# Patient Record
Sex: Male | Born: 1935 | Race: White | Hispanic: No | Smoking: Never smoker
Health system: Southern US, Community
[De-identification: ages and names within clinical notes are randomized; demographics above are authoritative.]

## PROBLEM LIST (undated history)

## (undated) DIAGNOSIS — I4891 Unspecified atrial fibrillation: Secondary | ICD-10-CM

## (undated) DIAGNOSIS — I509 Heart failure, unspecified: Secondary | ICD-10-CM

## (undated) DIAGNOSIS — F0390 Unspecified dementia without behavioral disturbance: Secondary | ICD-10-CM

## (undated) DIAGNOSIS — I10 Essential (primary) hypertension: Secondary | ICD-10-CM

## (undated) DIAGNOSIS — M1990 Unspecified osteoarthritis, unspecified site: Secondary | ICD-10-CM

## (undated) DIAGNOSIS — I351 Nonrheumatic aortic (valve) insufficiency: Secondary | ICD-10-CM

## (undated) DIAGNOSIS — IMO0002 Reserved for concepts with insufficient information to code with codable children: Secondary | ICD-10-CM

## (undated) DIAGNOSIS — I340 Nonrheumatic mitral (valve) insufficiency: Secondary | ICD-10-CM

## (undated) HISTORY — PX: BACK SURGERY: SHX140

## (undated) HISTORY — PX: APPENDECTOMY: SHX54

---

## 2010-06-09 ENCOUNTER — Encounter: Admission: RE | Admit: 2010-06-09 | Discharge: 2010-06-09 | Payer: Self-pay | Admitting: Internal Medicine

## 2010-06-23 ENCOUNTER — Ambulatory Visit (HOSPITAL_COMMUNITY): Admission: RE | Admit: 2010-06-23 | Discharge: 2010-06-23 | Payer: Self-pay | Admitting: Gastroenterology

## 2010-09-05 ENCOUNTER — Ambulatory Visit
Admission: RE | Admit: 2010-09-05 | Discharge: 2010-09-05 | Payer: Self-pay | Source: Home / Self Care | Attending: Internal Medicine | Admitting: Internal Medicine

## 2010-09-05 ENCOUNTER — Encounter (INDEPENDENT_AMBULATORY_CARE_PROVIDER_SITE_OTHER): Payer: Self-pay | Admitting: Internal Medicine

## 2010-12-21 ENCOUNTER — Emergency Department (HOSPITAL_COMMUNITY): Payer: Medicare Other

## 2010-12-21 ENCOUNTER — Encounter (HOSPITAL_COMMUNITY): Payer: Self-pay | Admitting: Radiology

## 2010-12-21 ENCOUNTER — Emergency Department (HOSPITAL_COMMUNITY)
Admission: EM | Admit: 2010-12-21 | Discharge: 2010-12-21 | Disposition: A | Discharge status: Home / Self Care | Payer: Medicare Other | Attending: Emergency Medicine | Admitting: Emergency Medicine

## 2010-12-21 DIAGNOSIS — G319 Degenerative disease of nervous system, unspecified: Secondary | ICD-10-CM | POA: Insufficient documentation

## 2010-12-21 DIAGNOSIS — I10 Essential (primary) hypertension: Secondary | ICD-10-CM | POA: Insufficient documentation

## 2010-12-21 DIAGNOSIS — R51 Headache: Secondary | ICD-10-CM | POA: Insufficient documentation

## 2010-12-21 HISTORY — DX: Essential (primary) hypertension: I10

## 2010-12-21 LAB — POCT I-STAT, CHEM 8
BUN: 20 mg/dL (ref 6–23)
Chloride: 107 mEq/L (ref 96–112)
HCT: 45 % (ref 39.0–52.0)
Potassium: 4 mEq/L (ref 3.5–5.1)
Sodium: 138 mEq/L (ref 135–145)

## 2010-12-21 LAB — DIFFERENTIAL
Lymphs Abs: 1.6 10*3/uL (ref 0.7–4.0)
Monocytes Relative: 11 % (ref 3–12)
Neutro Abs: 3.4 10*3/uL (ref 1.7–7.7)
Neutrophils Relative %: 56 % (ref 43–77)

## 2010-12-21 LAB — SEDIMENTATION RATE: Sed Rate: 5 mm/hr (ref 0–16)

## 2010-12-21 LAB — CBC
Hemoglobin: 15.2 g/dL (ref 13.0–17.0)
MCH: 34.3 pg — ABNORMAL HIGH (ref 26.0–34.0)
MCV: 98.9 fL (ref 78.0–100.0)
RBC: 4.43 MIL/uL (ref 4.22–5.81)
WBC: 6 10*3/uL (ref 4.0–10.5)

## 2010-12-21 LAB — PROTIME-INR: Prothrombin Time: 14.4 seconds (ref 11.6–15.2)

## 2011-02-07 ENCOUNTER — Other Ambulatory Visit: Payer: Self-pay | Admitting: Internal Medicine

## 2011-02-15 ENCOUNTER — Other Ambulatory Visit: Payer: Medicare Other

## 2011-03-01 ENCOUNTER — Ambulatory Visit
Admission: RE | Admit: 2011-03-01 | Discharge: 2011-03-01 | Disposition: A | Discharge status: Home / Self Care | Payer: Medicare Other | Source: Ambulatory Visit | Attending: Internal Medicine | Admitting: Internal Medicine

## 2011-12-29 ENCOUNTER — Other Ambulatory Visit (HOSPITAL_COMMUNITY): Payer: Self-pay | Admitting: Internal Medicine

## 2012-01-03 ENCOUNTER — Ambulatory Visit (HOSPITAL_COMMUNITY)
Admission: RE | Admit: 2012-01-03 | Discharge: 2012-01-03 | Disposition: A | Discharge status: Home / Self Care | Payer: Medicare Other | Source: Ambulatory Visit | Attending: Internal Medicine | Admitting: Internal Medicine

## 2012-01-03 DIAGNOSIS — R1310 Dysphagia, unspecified: Secondary | ICD-10-CM | POA: Insufficient documentation

## 2012-01-03 DIAGNOSIS — R1313 Dysphagia, pharyngeal phase: Secondary | ICD-10-CM | POA: Insufficient documentation

## 2012-01-03 NOTE — Procedures (Signed)
Objective Swallowing Evaluation: Modified Barium Swallowing Study  Patient Details  Name: Sean Carlson MRN: [id number] Date of Birth: 28-Jul-1926  Today's Date: 01/03/2012 Time:  -     Past Medical History:  Past Medical History  Diagnosis Date  . Hypertension    Past Surgical History: No past surgical history on file. HPI:  78 yr old seen at [facility] for MBS accompanied by his son.  Pt. denies difficulty, however, son reports pt. had esophageal dilitation approximately one year ago at which time he was experiencing freqeunt expectoration of saliva that appeared to improve somewhat after procedure.  Son denies pt. coughing related to food/liquid.  Pt. with several episodes of slurred speech with CVA ruled out but possible TIA's per son.  No history of pneumonia.  Pt. lives in independent section at [facility] with frequent checks from family.     Recommendation/Prognosis  Clinical Impression Dysphagia Diagnosis: Mild pharyngeal phase dysphagia Clinical impression: Pt. exhibited min-mild pharyngeal dysphagia marked by reduced tongue base retraction and mildly decreased pharyngeal dysphagia resulting in min-mild vallecular and pyriform sinus residue.  Pt. with adequate sensation to produce reflexive second swalllow that cleared residue.  One episode of laryngeal penetration out of multiple trials with thin that was flash only (spontaneously exited laryngeal vestibule during the swallow).  Esophagus was scanned which did not reveal overt deficits, although the MBS only diagnoses the upper esophageal sphincter. Recommend pt. continue with a regular texture diet and thin liquids with small cup sips, eat slower and perform a second swallow if needed.  Swallow Evaluation Recommendations Diet Recommendations: Regular;Thin liquid Liquid Administration via: Cup Medication Administration: Whole meds with liquid Supervision: Patient able to self feed Compensations: Slow rate;Small sips/bites;Multiple  dry swallows after each bite/sip Postural Changes and/or Swallow Maneuvers: Seated upright 90 degrees Follow up Recommendations: None   Individuals Consulted Consulted and Agree with Results and Recommendations: Patient;Family member/caregiver  SLP Assessment/Plan Dysphagia Diagnosis: Mild pharyngeal phase dysphagia Clinical impression: Pt. exhibited min-mild pharyngeal dysphagia marked by reduced tongue base retraction and mildly decreased pharyngeal dysphagia resulting in min-mild vallecular and pyriform sinus residue.  Pt. with adequate sensation to produce reflexive second swalllow that cleared residue.  One episode of laryngeal penetration out of multiple trials with thin that was flash only (spontaneously exited laryngeal vestibule during the swallow).  Esophagus was scanned which did not reveal overt deficits, although the MBS only diagnoses the upper esophageal sphincter. Recommend pt. continue with a regular texture diet and thin liquids with small cup sips, eat slower and perform a second swallow if needed.   General:  HPI: 78 yr old seen at [facility] for MBS accompanied by his son.  Pt. denies difficulty, however, son reports pt. had esophageal dilitation approximately one year ago at which time he was experiencing freqeunt expectoration of saliva that appeared to improve somewhat after procedure.  Son denies pt. coughing related to food/liquid.  Pt. with several episodes of slurred speech with CVA ruled out but possible TIA's per son.  No history of pneumonia.  Pt. lives in independent section at [facility] with frequent checks from family. Type of Study: Modified Barium Swallowing Study Diet Prior to this Study: Regular;Thin liquids Respiratory Status: Room air Behavior/Cognition: Alert;Cooperative;Pleasant mood (mild confusion) Oral Cavity - Dentition: Adequate natural dentition Oral Motor / Sensory Function: Impaired motor (lingual deviation to right, possible slight left labial  droo) Oral impairment: Left labial Vision: Functional for self-feeding Patient Positioning: Upright in chair Baseline Vocal Quality: Wet (intermittently wet prior to  po's) Volitional Cough: Strong Volitional Swallow:  (NT) Anatomy: Within functional limits Pharyngeal Secretions: Not observed secondary MBS  Oral Phase Oral Preparation/Oral Phase Oral Phase: WFL Pharyngeal Phase  Pharyngeal Phase Pharyngeal Phase: Impaired Pharyngeal - Thin Pharyngeal - Thin Teaspoon: Reduced tongue base retraction;Reduced laryngeal elevation;Pharyngeal residue - valleculae;Pharyngeal residue - pyriform (min-mild) Pharyngeal - Thin Cup: Reduced laryngeal elevation;Reduced tongue base retraction;Pharyngeal residue - valleculae;Pharyngeal residue - pyriform;Penetration/Aspiration during swallow (min-mild) Penetration/Aspiration details (thin cup): Material enters airway, remains ABOVE vocal cords then ejected out (flash x 1) Pharyngeal - Solids Pharyngeal - Puree: Within functional limits Pharyngeal - Regular: Within functional limits Cervical Esophageal Phase  Cervical Esophageal Phase Cervical Esophageal Phase: [facility]   Darrow Bussing.Ed [employer] [phone]  01/03/2012

## 2013-10-28 ENCOUNTER — Other Ambulatory Visit: Payer: Self-pay | Admitting: Internal Medicine

## 2013-10-28 ENCOUNTER — Ambulatory Visit
Admission: RE | Admit: 2013-10-28 | Discharge: 2013-10-28 | Disposition: A | Discharge status: Home / Self Care | Payer: Medicare Other | Source: Ambulatory Visit | Attending: Internal Medicine | Admitting: Internal Medicine

## 2013-10-28 DIAGNOSIS — R609 Edema, unspecified: Secondary | ICD-10-CM

## 2014-01-10 ENCOUNTER — Emergency Department (HOSPITAL_COMMUNITY)
Admission: EM | Admit: 2014-01-10 | Discharge: 2014-01-10 | Disposition: A | Discharge status: Home / Self Care | Payer: Medicare Other | Attending: Emergency Medicine | Admitting: Emergency Medicine

## 2014-01-10 ENCOUNTER — Encounter (HOSPITAL_COMMUNITY): Payer: Self-pay | Admitting: Emergency Medicine

## 2014-01-10 ENCOUNTER — Emergency Department (HOSPITAL_COMMUNITY): Payer: Medicare Other

## 2014-01-10 DIAGNOSIS — R55 Syncope and collapse: Secondary | ICD-10-CM

## 2014-01-10 DIAGNOSIS — F0390 Unspecified dementia without behavioral disturbance: Secondary | ICD-10-CM | POA: Insufficient documentation

## 2014-01-10 DIAGNOSIS — E872 Acidosis, unspecified: Secondary | ICD-10-CM

## 2014-01-10 DIAGNOSIS — R61 Generalized hyperhidrosis: Secondary | ICD-10-CM | POA: Insufficient documentation

## 2014-01-10 DIAGNOSIS — I10 Essential (primary) hypertension: Secondary | ICD-10-CM | POA: Insufficient documentation

## 2014-01-10 DIAGNOSIS — R197 Diarrhea, unspecified: Secondary | ICD-10-CM | POA: Insufficient documentation

## 2014-01-10 DIAGNOSIS — R42 Dizziness and giddiness: Secondary | ICD-10-CM | POA: Insufficient documentation

## 2014-01-10 DIAGNOSIS — Z8739 Personal history of other diseases of the musculoskeletal system and connective tissue: Secondary | ICD-10-CM | POA: Insufficient documentation

## 2014-01-10 HISTORY — DX: Unspecified dementia, unspecified severity, without behavioral disturbance, psychotic disturbance, mood disturbance, and anxiety: F03.90

## 2014-01-10 HISTORY — DX: Unspecified osteoarthritis, unspecified site: M19.90

## 2014-01-10 LAB — URINE MICROSCOPIC-ADD ON

## 2014-01-10 LAB — URINALYSIS, ROUTINE W REFLEX MICROSCOPIC
Bilirubin Urine: NEGATIVE
Glucose, UA: NEGATIVE mg/dL
Ketones, ur: NEGATIVE mg/dL
LEUKOCYTES UA: NEGATIVE
NITRITE: NEGATIVE
PH: 5.5 (ref 5.0–8.0)
Protein, ur: NEGATIVE mg/dL
SPECIFIC GRAVITY, URINE: 1.026 (ref 1.005–1.030)
Urobilinogen, UA: 1 mg/dL (ref 0.0–1.0)

## 2014-01-10 LAB — COMPREHENSIVE METABOLIC PANEL
ALBUMIN: 3.4 g/dL — AB (ref 3.5–5.2)
ALT: 37 U/L (ref 0–53)
AST: 40 U/L — ABNORMAL HIGH (ref 0–37)
Alkaline Phosphatase: 85 U/L (ref 39–117)
BUN: 31 mg/dL — AB (ref 6–23)
CO2: 20 mEq/L (ref 19–32)
CREATININE: 1.16 mg/dL (ref 0.50–1.35)
Calcium: 9.1 mg/dL (ref 8.4–10.5)
Chloride: 105 mEq/L (ref 96–112)
GFR calc Af Amer: 64 mL/min — ABNORMAL LOW (ref >90)
GFR calc non Af Amer: 55 mL/min — ABNORMAL LOW (ref >90)
Glucose, Bld: 175 mg/dL — ABNORMAL HIGH (ref 70–99)
Potassium: 4.4 mEq/L (ref 3.7–5.3)
Sodium: 140 mEq/L (ref 137–147)
TOTAL PROTEIN: 6.2 g/dL (ref 6.0–8.3)
Total Bilirubin: 0.9 mg/dL (ref 0.3–1.2)

## 2014-01-10 LAB — I-STAT CG4 LACTIC ACID, ED
LACTIC ACID, VENOUS: 1.06 mmol/L (ref 0.5–2.2)
Lactic Acid, Venous: 2.41 mmol/L — ABNORMAL HIGH (ref 0.5–2.2)

## 2014-01-10 LAB — CBC WITH DIFFERENTIAL/PLATELET
BASOS PCT: 0 % (ref 0–1)
Basophils Absolute: 0 10*3/uL (ref 0.0–0.1)
EOS ABS: 0.3 10*3/uL (ref 0.0–0.7)
Eosinophils Relative: 5 % (ref 0–5)
HEMATOCRIT: 47.8 % (ref 39.0–52.0)
HEMOGLOBIN: 16.9 g/dL (ref 13.0–17.0)
Lymphocytes Relative: 33 % (ref 12–46)
Lymphs Abs: 2.3 10*3/uL (ref 0.7–4.0)
MCH: 35.4 pg — AB (ref 26.0–34.0)
MCHC: 35.4 g/dL (ref 30.0–36.0)
MCV: 100.2 fL — ABNORMAL HIGH (ref 78.0–100.0)
MONO ABS: 0.6 10*3/uL (ref 0.1–1.0)
MONOS PCT: 8 % (ref 3–12)
NEUTROS PCT: 54 % (ref 43–77)
Neutro Abs: 3.9 10*3/uL (ref 1.7–7.7)
Platelets: 151 10*3/uL (ref 150–400)
RBC: 4.77 MIL/uL (ref 4.22–5.81)
RDW: 12.1 % (ref 11.5–15.5)
WBC: 7.1 10*3/uL (ref 4.0–10.5)

## 2014-01-10 LAB — CBG MONITORING, ED: Glucose-Capillary: 184 mg/dL — ABNORMAL HIGH (ref 70–99)

## 2014-01-10 LAB — TROPONIN I

## 2014-01-10 MED ORDER — SODIUM CHLORIDE 0.9 % IV SOLN
INTRAVENOUS | Status: DC
  Administered 2014-01-10: 14:00:00 via INTRAVENOUS

## 2014-01-10 MED ORDER — SODIUM CHLORIDE 0.9 % IV BOLUS (SEPSIS)
1000.0000 mL | Freq: Once | INTRAVENOUS | Status: AC
  Administered 2014-01-10: 1000 mL via INTRAVENOUS

## 2014-01-10 MED ORDER — LOPERAMIDE HCL 2 MG PO CAPS: 2.0000 mg | ORAL_CAPSULE | Freq: Four times a day (QID) | ORAL | Status: DC | PRN

## 2014-01-10 NOTE — ED Notes (Signed)
NOTIFIED DR. LOCKWOOD OF PATIENTS LAB RESULTS OF CG4+ LACTIC ACID ,16:24 PM ,01/10/2014.

## 2014-01-10 NOTE — ED Notes (Signed)
Patient returned from X-ray 

## 2014-01-10 NOTE — ED Provider Notes (Signed)
CSN: [id number]     Arrival date & time 01/10/14  1059 History   First MD Initiated Contact with Patient 01/10/14 1106     Chief Complaint  Patient presents with  . Near Syncope      HPI  Patient presents after an episode of near syncope. Patient is a nursing home resident. Patient has dementia, level V caveat. Per report the patient was defecating, became diaphoretic, lightheaded, but did not fall, did not lose consciousness. Patient denies pain either during the event, or currently. He does describe generalized weakness, but no focal weakness. Per EMS the patient was pale on their arrival, improved in route.   Past Medical History  Diagnosis Date  . Hypertension   . Dementia   . Osteoarthritis    Past Surgical History  Procedure Laterality Date  . Appendectomy     History reviewed. No pertinent family history. History  Substance Use Topics  . Smoking status: Never Smoker   . Smokeless tobacco: Not on file  . Alcohol Use: Yes     Comment: occasionally    Review of Systems  Constitutional:       Per HPI, otherwise negative  HENT:       Per HPI, otherwise negative  Respiratory:       Per HPI, otherwise negative  Cardiovascular:       Per HPI, otherwise negative  Gastrointestinal: Positive for diarrhea. Negative for vomiting and abdominal pain.  Endocrine:       Negative aside from HPI  Genitourinary:       Neg aside from HPI   Musculoskeletal:       Per HPI, otherwise negative  Skin: Positive for pallor.  Neurological: Positive for light-headedness. Negative for syncope.      Allergies  Review of patient's allergies indicates no known allergies.  Home Medications   Prior to Admission medications   Not on File   BP 146/64  Pulse 65  Temp(Src) 98.3 F (36.8 C) (Oral)  Resp 20  Ht 5\' 10"  (1.778 m)  Wt 148 lb (67.132 kg)  BMI 21.24 kg/m2  SpO2 100% Physical Exam  Nursing note and vitals reviewed. Constitutional: He is oriented to person,  place, and time. He appears well-developed. No distress.  HENT:  Head: Normocephalic and atraumatic.  Eyes: Conjunctivae and EOM are normal.  Cardiovascular: Normal rate and regular rhythm.   Pulmonary/Chest: Effort normal. No stridor. No respiratory distress.  Abdominal: He exhibits no distension. There is no tenderness. There is no rebound and no guarding.  Musculoskeletal: He exhibits no edema.  Neurological: He is alert and oriented to person, place, and time.  Skin: Skin is warm and dry.  Psychiatric: He has a normal mood and affect.    ED Course  Procedures (including critical care time) Labs Review Labs Reviewed  CBC WITH DIFFERENTIAL - Abnormal; Notable for the following:    MCV 100.2 (*)    MCH 35.4 (*)    All other components within normal limits  I-STAT CG4 LACTIC ACID, ED - Abnormal; Notable for the following:    Lactic Acid, Venous 2.41 (*)    All other components within normal limits  CBG MONITORING, ED - Abnormal; Notable for the following:    Glucose-Capillary 184 (*)    All other components within normal limits  COMPREHENSIVE METABOLIC PANEL  TROPONIN I  URINALYSIS, ROUTINE W REFLEX MICROSCOPIC  POCT CBG (FASTING - GLUCOSE)-MANUAL ENTRY    Imaging Review Dg Chest 2 View  01/10/2014  CLINICAL DATA:  Syncope  EXAM: CHEST  2 VIEW  COMPARISON:  DG CHEST 2V dated 08/30/2011  FINDINGS: Heart size upper normal to mildly enlarged. Vascular pattern normal. Lungs clear. No effusions.  IMPRESSION: No active cardiopulmonary disease.   Electronically Signed   By: Esperanza Heiraymond  Rubner M.D.   On: 01/10/2014 11:59     EKG Interpretation   Date/Time:  Saturday January 10 2014 11:24:58 EDT Ventricular Rate:  65 PR Interval:  214 QRS Duration: 90 QT Interval:  490 QTC Calculation: 510 R Axis:   -12 Text Interpretation:  Sinus rhythm Borderline prolonged PR interval  Anteroseptal infarct, old Nonspecific T abnormalities, lateral leads  Prolonged QT interval Sinus rhythm T  wave inversion , new Abnormal ekg  Confirmed by Gerhard MunchLOCKWOOD, Marisol Giambra  MD ([id number]) on 01/10/2014 12:15:20 PM       1:31 PM Patient comfortable VS stable UA pending  3:45 PM Patient HD stable, no complaints. Repeat lactic pending.  4:41 PM VS unremarkable Repeat lactic acid 1  Patient d/c w son  MDM   Patient presents after an episode of near syncope, while defecating. Here the patient has no evidence of dysrhythmia, no decompensation. Patient's initial labs are notable for mild elevation in lactic acid, though this improved substantially with fluid resuscitation here.  The patient is afebrile, in no distress, with a soft, non-peritoneal abdomen. Within this improvement, no decompensation, and with stable vital signs throughout, the patient was discharged in stable condition with his son back to a monitored facility to follow up with his primary care physician.    Gerhard Munchobert Bohdan Macho, MD 01/10/14 [phone]

## 2014-01-10 NOTE — Discharge Instructions (Signed)
As discussed, it is important that you follow up as soon as possible with your physician for continued management of your condition. ° °If you develop any new, or concerning changes in your condition, please return to the emergency department immediately. ° °

## 2014-01-10 NOTE — ED Notes (Signed)
From [facility] assisted living; during a bowel movement he became diaphoretic and felt like he was going to pass out, but no LOC. EMS reports he was pale on arrival, VSS. He arrives with loose stools on body; he reports loose stool this morning x1. Denies pain. States feels a little short of breath.

## 2014-01-10 NOTE — ED Notes (Signed)
NOTIFIED DR. LOCKWOOD IN PERSON FOR PATIENTS LAB RESULTS OF CG4+ LACTIC ACID ,@12 ;;;;;;;;;;;;00 PM ,01/10/2014.

## 2014-01-10 NOTE — ED Notes (Signed)
Patient transported to X-ray 

## 2014-01-13 ENCOUNTER — Other Ambulatory Visit: Payer: Self-pay | Admitting: Internal Medicine

## 2014-01-13 ENCOUNTER — Ambulatory Visit
Admission: RE | Admit: 2014-01-13 | Discharge: 2014-01-13 | Disposition: A | Discharge status: Home / Self Care | Payer: Medicare Other | Source: Ambulatory Visit | Attending: Internal Medicine | Admitting: Internal Medicine

## 2014-01-13 DIAGNOSIS — R600 Localized edema: Secondary | ICD-10-CM

## 2014-03-19 ENCOUNTER — Encounter (HOSPITAL_COMMUNITY): Payer: Self-pay | Admitting: Emergency Medicine

## 2014-03-19 ENCOUNTER — Inpatient Hospital Stay (HOSPITAL_COMMUNITY)
Admission: EM | Admit: 2014-03-19 | Discharge: 2014-03-24 | DRG: 292 | Disposition: A | Discharge status: Skilled Nursing Facility | Payer: Medicare Other | Attending: Internal Medicine | Admitting: Internal Medicine

## 2014-03-19 ENCOUNTER — Emergency Department (HOSPITAL_COMMUNITY): Payer: Medicare Other

## 2014-03-19 DIAGNOSIS — J9801 Acute bronchospasm: Secondary | ICD-10-CM | POA: Diagnosis present

## 2014-03-19 DIAGNOSIS — I5041 Acute combined systolic (congestive) and diastolic (congestive) heart failure: Secondary | ICD-10-CM

## 2014-03-19 DIAGNOSIS — R918 Other nonspecific abnormal finding of lung field: Secondary | ICD-10-CM | POA: Diagnosis present

## 2014-03-19 DIAGNOSIS — I5023 Acute on chronic systolic (congestive) heart failure: Principal | ICD-10-CM | POA: Diagnosis present

## 2014-03-19 DIAGNOSIS — I10 Essential (primary) hypertension: Secondary | ICD-10-CM

## 2014-03-19 DIAGNOSIS — Z9089 Acquired absence of other organs: Secondary | ICD-10-CM

## 2014-03-19 DIAGNOSIS — Z79899 Other long term (current) drug therapy: Secondary | ICD-10-CM

## 2014-03-19 DIAGNOSIS — R5381 Other malaise: Secondary | ICD-10-CM | POA: Diagnosis present

## 2014-03-19 DIAGNOSIS — I4892 Unspecified atrial flutter: Secondary | ICD-10-CM | POA: Diagnosis present

## 2014-03-19 DIAGNOSIS — I498 Other specified cardiac arrhythmias: Secondary | ICD-10-CM | POA: Diagnosis present

## 2014-03-19 DIAGNOSIS — J9819 Other pulmonary collapse: Secondary | ICD-10-CM | POA: Diagnosis present

## 2014-03-19 DIAGNOSIS — I509 Heart failure, unspecified: Secondary | ICD-10-CM | POA: Diagnosis present

## 2014-03-19 DIAGNOSIS — I4510 Unspecified right bundle-branch block: Secondary | ICD-10-CM | POA: Diagnosis present

## 2014-03-19 DIAGNOSIS — M1990 Unspecified osteoarthritis, unspecified site: Secondary | ICD-10-CM | POA: Diagnosis present

## 2014-03-19 DIAGNOSIS — F0391 Unspecified dementia with behavioral disturbance: Secondary | ICD-10-CM

## 2014-03-19 DIAGNOSIS — R7989 Other specified abnormal findings of blood chemistry: Secondary | ICD-10-CM | POA: Diagnosis present

## 2014-03-19 DIAGNOSIS — Z8249 Family history of ischemic heart disease and other diseases of the circulatory system: Secondary | ICD-10-CM

## 2014-03-19 DIAGNOSIS — F0390 Unspecified dementia without behavioral disturbance: Secondary | ICD-10-CM

## 2014-03-19 DIAGNOSIS — K219 Gastro-esophageal reflux disease without esophagitis: Secondary | ICD-10-CM | POA: Diagnosis present

## 2014-03-19 DIAGNOSIS — Z66 Do not resuscitate: Secondary | ICD-10-CM | POA: Diagnosis present

## 2014-03-19 LAB — CBC WITH DIFFERENTIAL/PLATELET
Basophils Absolute: 0 10*3/uL (ref 0.0–0.1)
Basophils Relative: 0 % (ref 0–1)
Eosinophils Absolute: 0.4 10*3/uL (ref 0.0–0.7)
Eosinophils Relative: 6 % — ABNORMAL HIGH (ref 0–5)
HCT: 45.6 % (ref 39.0–52.0)
Hemoglobin: 15.3 g/dL (ref 13.0–17.0)
Lymphocytes Relative: 30 % (ref 12–46)
Lymphs Abs: 2.3 10*3/uL (ref 0.7–4.0)
MCH: 35 pg — ABNORMAL HIGH (ref 26.0–34.0)
MCHC: 33.6 g/dL (ref 30.0–36.0)
MCV: 104.3 fL — ABNORMAL HIGH (ref 78.0–100.0)
Monocytes Absolute: 0.8 10*3/uL (ref 0.1–1.0)
Monocytes Relative: 10 % (ref 3–12)
Neutro Abs: 4.1 10*3/uL (ref 1.7–7.7)
Neutrophils Relative %: 54 % (ref 43–77)
Platelets: 149 10*3/uL — ABNORMAL LOW (ref 150–400)
RBC: 4.37 MIL/uL (ref 4.22–5.81)
RDW: 13.7 % (ref 11.5–15.5)
WBC: 7.7 10*3/uL (ref 4.0–10.5)

## 2014-03-19 LAB — BASIC METABOLIC PANEL
Anion gap: 13 (ref 5–15)
BUN: 35 mg/dL — ABNORMAL HIGH (ref 6–23)
CO2: 24 mEq/L (ref 19–32)
Calcium: 8.7 mg/dL (ref 8.4–10.5)
Chloride: 103 mEq/L (ref 96–112)
Creatinine, Ser: 0.91 mg/dL (ref 0.50–1.35)
GFR calc Af Amer: 86 mL/min — ABNORMAL LOW (ref >90)
GFR calc non Af Amer: 75 mL/min — ABNORMAL LOW (ref >90)
Glucose, Bld: 130 mg/dL — ABNORMAL HIGH (ref 70–99)
Potassium: 4.3 mEq/L (ref 3.7–5.3)
Sodium: 140 mEq/L (ref 137–147)

## 2014-03-19 LAB — PRO B NATRIURETIC PEPTIDE: Pro B Natriuretic peptide (BNP): 2813 pg/mL — ABNORMAL HIGH (ref 0–450)

## 2014-03-19 LAB — TROPONIN I: Troponin I: <0.3 ng/mL (ref <0.30)

## 2014-03-19 LAB — TSH: TSH: 2.49 u[IU]/mL (ref 0.350–4.500)

## 2014-03-19 MED ORDER — CEFTRIAXONE SODIUM 1 G IJ SOLR
1.0000 g | Freq: Once | INTRAMUSCULAR | Status: AC
  Administered 2014-03-19: 1 g via INTRAVENOUS
  Filled 2014-03-19: qty 10

## 2014-03-19 MED ORDER — DOCUSATE SODIUM 100 MG PO CAPS
100.0000 mg | ORAL_CAPSULE | Freq: Two times a day (BID) | ORAL | Status: DC
  Administered 2014-03-19 – 2014-03-24 (×8): 100 mg via ORAL
  Filled 2014-03-19 (×11): qty 1

## 2014-03-19 MED ORDER — IPRATROPIUM BROMIDE 0.02 % IN SOLN
0.5000 mg | Freq: Once | RESPIRATORY_TRACT | Status: AC
  Administered 2014-03-19: 0.5 mg via RESPIRATORY_TRACT
  Filled 2014-03-19: qty 2.5

## 2014-03-19 MED ORDER — POTASSIUM CHLORIDE CRYS ER 20 MEQ PO TBCR
20.0000 meq | EXTENDED_RELEASE_TABLET | Freq: Every day | ORAL | Status: DC
  Administered 2014-03-20 – 2014-03-24 (×5): 20 meq via ORAL
  Filled 2014-03-19 (×5): qty 1

## 2014-03-19 MED ORDER — ACETAMINOPHEN 650 MG RE SUPP: 650.0000 mg | Freq: Four times a day (QID) | RECTAL | Status: DC | PRN

## 2014-03-19 MED ORDER — MEMANTINE HCL 10 MG PO TABS
10.0000 mg | ORAL_TABLET | Freq: Two times a day (BID) | ORAL | Status: DC
  Administered 2014-03-19 – 2014-03-24 (×9): 10 mg via ORAL
  Filled 2014-03-19 (×11): qty 1

## 2014-03-19 MED ORDER — LISINOPRIL 5 MG PO TABS
5.0000 mg | ORAL_TABLET | Freq: Every day | ORAL | Status: DC
  Administered 2014-03-20: 5 mg via ORAL
  Filled 2014-03-19: qty 1

## 2014-03-19 MED ORDER — ONDANSETRON HCL 4 MG PO TABS: 4.0000 mg | ORAL_TABLET | Freq: Four times a day (QID) | ORAL | Status: DC | PRN

## 2014-03-19 MED ORDER — DONEPEZIL HCL 10 MG PO TABS
10.0000 mg | ORAL_TABLET | Freq: Every day | ORAL | Status: DC
  Administered 2014-03-19 – 2014-03-23 (×4): 10 mg via ORAL
  Filled 2014-03-19 (×6): qty 1

## 2014-03-19 MED ORDER — TAMSULOSIN HCL 0.4 MG PO CAPS
0.4000 mg | ORAL_CAPSULE | Freq: Every day | ORAL | Status: DC
  Administered 2014-03-20 – 2014-03-24 (×5): 0.4 mg via ORAL
  Filled 2014-03-19 (×5): qty 1

## 2014-03-19 MED ORDER — ALBUTEROL SULFATE (2.5 MG/3ML) 0.083% IN NEBU
5.0000 mg | INHALATION_SOLUTION | Freq: Once | RESPIRATORY_TRACT | Status: AC
  Administered 2014-03-19: 5 mg via RESPIRATORY_TRACT
  Filled 2014-03-19: qty 6

## 2014-03-19 MED ORDER — ACETAMINOPHEN 325 MG PO TABS
650.0000 mg | ORAL_TABLET | Freq: Four times a day (QID) | ORAL | Status: DC | PRN
  Administered 2014-03-22: 650 mg via ORAL
  Filled 2014-03-19 (×2): qty 2

## 2014-03-19 MED ORDER — ONDANSETRON HCL 4 MG/2ML IJ SOLN: 4.0000 mg | Freq: Four times a day (QID) | INTRAMUSCULAR | Status: DC | PRN

## 2014-03-19 MED ORDER — DUTASTERIDE 0.5 MG PO CAPS
0.5000 mg | ORAL_CAPSULE | Freq: Every day | ORAL | Status: DC
  Administered 2014-03-20 – 2014-03-24 (×5): 0.5 mg via ORAL
  Filled 2014-03-19 (×5): qty 1

## 2014-03-19 MED ORDER — BIOTENE DRY MOUTH MT LIQD
15.0000 mL | Freq: Two times a day (BID) | OROMUCOSAL | Status: DC
  Administered 2014-03-20 – 2014-03-24 (×9): 15 mL via OROMUCOSAL

## 2014-03-19 MED ORDER — SENNOSIDES-DOCUSATE SODIUM 8.6-50 MG PO TABS
1.0000 | ORAL_TABLET | Freq: Every evening | ORAL | Status: DC | PRN
  Filled 2014-03-19: qty 1

## 2014-03-19 MED ORDER — DEXTROSE 5 % IV SOLN
500.0000 mg | Freq: Once | INTRAVENOUS | Status: AC
  Administered 2014-03-19: 500 mg via INTRAVENOUS
  Filled 2014-03-19 (×2): qty 500

## 2014-03-19 MED ORDER — PNEUMOCOCCAL VAC POLYVALENT 25 MCG/0.5ML IJ INJ
0.5000 mL | INJECTION | INTRAMUSCULAR | Status: AC
Start: 2014-03-20 — End: 2014-03-20
  Administered 2014-03-20: 0.5 mL via INTRAMUSCULAR
  Filled 2014-03-19: qty 0.5

## 2014-03-19 MED ORDER — DUTASTERIDE-TAMSULOSIN HCL 0.5-0.4 MG PO CAPS: 1.0000 | ORAL_CAPSULE | Freq: Every day | ORAL | Status: DC

## 2014-03-19 MED ORDER — ALUM & MAG HYDROXIDE-SIMETH 200-200-20 MG/5ML PO SUSP: 30.0000 mL | Freq: Four times a day (QID) | ORAL | Status: DC | PRN

## 2014-03-19 MED ORDER — FUROSEMIDE 10 MG/ML IJ SOLN
40.0000 mg | Freq: Once | INTRAMUSCULAR | Status: AC
  Administered 2014-03-19: 40 mg via INTRAVENOUS
  Filled 2014-03-19: qty 4

## 2014-03-19 MED ORDER — FUROSEMIDE 10 MG/ML IJ SOLN
40.0000 mg | Freq: Two times a day (BID) | INTRAMUSCULAR | Status: DC
  Administered 2014-03-19 – 2014-03-22 (×6): 40 mg via INTRAVENOUS
  Filled 2014-03-19 (×8): qty 4

## 2014-03-19 MED ORDER — ENOXAPARIN SODIUM 40 MG/0.4ML ~~LOC~~ SOLN
40.0000 mg | SUBCUTANEOUS | Status: DC
  Administered 2014-03-19 – 2014-03-23 (×5): 40 mg via SUBCUTANEOUS
  Filled 2014-03-19 (×7): qty 0.4

## 2014-03-19 NOTE — ED Notes (Signed)
Attempted report 

## 2014-03-19 NOTE — ED Provider Notes (Signed)
CSN: [id number]     Arrival date & time 03/19/14  1424 History   First MD Initiated Contact with Patient 03/19/14 1431     No chief complaint on file.    (Consider location/radiation/quality/duration/timing/severity/associated sxs/prior Treatment) HPI  80 year old male transferred from nursing facility for evaluation of shortness of breath. Patient is not a particularly good historian. He has history of dementia. When asked how long he's been feeling short of breath he stated "years." When asked when it started getting worse he replied that it hasn't. He has obviously increased work of breathing though. He denies any pain. He does endorse a cough. Per EMS report he has had gradually worsening shortness of breath over the past 3-4 days. He's had worsening lower extremity edema over the same time line. No reported fever.   Past Medical History  Diagnosis Date  . Hypertension   . Dementia   . Osteoarthritis    Past Surgical History  Procedure Laterality Date  . Appendectomy     No family history on file. History  Substance Use Topics  . Smoking status: Never Smoker   . Smokeless tobacco: Not on file  . Alcohol Use: Yes     Comment: occasionally    Review of Systems  Level 5 caveat because of dementia.   Allergies  Review of patient's allergies indicates no known allergies.  Home Medications   Prior to Admission medications   Medication Sig Start Date End Date Taking? Authorizing Provider  barrier cream (NON-SPECIFIED) CREA Apply 1 application topically 2 (two) times daily. Apply to bottom    Historical Provider, MD  donepezil (ARICEPT) 10 MG tablet Take 10 mg by mouth at bedtime.    Historical Provider, MD  Dutasteride-Tamsulosin HCl (JALYN) 0.5-0.4 MG CAPS Take 1 capsule by mouth daily.    Historical Provider, MD  lisinopril (PRINIVIL,ZESTRIL) 5 MG tablet Take 5 mg by mouth daily.    Historical Provider, MD  loperamide (IMODIUM) 2 MG capsule Take 1 capsule (2 mg total) by  mouth 4 (four) times daily as needed for diarrhea or loose stools. 01/10/14   Gerhard Munchobert Lockwood, MD  meloxicam (MOBIC) 15 MG tablet Take 15 mg by mouth daily.    Historical Provider, MD  memantine (NAMENDA) 10 MG tablet Take 10 mg by mouth 2 (two) times daily.    Historical Provider, MD   There were no vitals taken for this visit. Physical Exam  Nursing note and vitals reviewed. Constitutional: He appears well-developed and well-nourished.  HENT:  Head: Normocephalic and atraumatic.  Eyes: Conjunctivae are normal. Right eye exhibits no discharge. Left eye exhibits no discharge.  Neck: Neck supple.  Cardiovascular: Normal rate, regular rhythm and normal heart sounds.  Exam reveals no gallop and no friction rub.   No murmur heard. Pulmonary/Chest: He is in respiratory distress.  Tachypnea. Crackles bases. Expiratory wheezing b/l.   Abdominal: Soft. He exhibits no distension. There is no tenderness.  Musculoskeletal: He exhibits edema. He exhibits no tenderness.  Pitting LE edema, R>L  Neurological: He is alert.  Skin: Skin is warm and dry. He is not diaphoretic.  Psychiatric: He has a normal mood and affect. His behavior is normal. Thought content normal.    ED Course  Procedures (including critical care time) Labs Review Labs Reviewed - No data to display  Imaging Review Dg Chest Portable 1 View  03/19/2014   CLINICAL DATA:  Shortness of breath  EXAM: PORTABLE CHEST - 1 VIEW  COMPARISON:  January 10, 2014  FINDINGS: The mediastinal contour is normal. The heart size is enlarged. There is interstitial pulmonary edema. There are small bilateral pleural effusions. Patchy consolidation of both lung bases are noted. These osseous structures are stable.  IMPRESSION: Congestive heart failure. Small bilateral pleural effusions. Consolidation of both lung bases in part due to atelectasis but superimposed pneumonia is not excluded.   Electronically Signed   By: Sherian ReinWei-Chen  Lin M.D.   On: 03/19/2014 14:58      EKG Interpretation   Date/Time:  Thursday March 19 2014 14:31:02 EDT Ventricular Rate:  112 PR Interval:  108 QRS Duration: 115 QT Interval:  402 QTC Calculation: 549 R Axis:   -124 Text Interpretation:  Sinus tachycardia Nonspecific intraventricular  conduction delay Anteroseptal infarct old new q waves 1, AVL Confirmed by  Shariya Gaster  MD, Haddy Mullinax ([id number]) on 03/19/2014 3:36:47 PM      MDM   Final diagnoses:  Acute congestive heart failure, unspecified congestive heart failure type    88yM with respiratory distress. Peripheral edema, elevated BNP and XR consistent with HF. No listed history of same. I could not locate prior ECHO. Additionally he is wheezing. Nebs given. Atelectasis versus possible pneumonia at b/l bases. Suspect atelectasis, but given presenting complaint, will tx as possible pneumonia.   Wheezing resolved after neb. WOB has improved. Admit for further tx/eval.   Raeford RazorStephen Raziah Funnell, MD 03/19/14 [phone]

## 2014-03-19 NOTE — ED Notes (Signed)
Pt placed into gown and on monitor upon arrival to room. Pt monitored by 12 lead, blood pressure, and pulse ox.  

## 2014-03-19 NOTE — ED Notes (Signed)
Admitting MD at bedside.

## 2014-03-19 NOTE — ED Notes (Signed)
Pt presents with worsening shortness of breath x 3-4 days.  Pt denies any cough, or chest pain.  Abdomen is distended, pitting edema noted in BLE. Wheezing is audible at bedside; albuterol x 1 neb given PTA.

## 2014-03-19 NOTE — Progress Notes (Signed)
1700-1900 shift Patient was a new admit from the ED. He was transported by stretcher accompanied by NT and his son. Pt.is A/Ox4, is on 2 L [place] of oxygen and has IV pump. He had no c/o pain and no signs of distress.

## 2014-03-19 NOTE — H&P (Signed)
[facility] History and Physical  Sean FillersDonald Carlson ZOX:[id number] DOB: Aug 06, 1926 DOA: 03/19/2014  Referring physician: Juleen Carlson PCP: Sean Carlson   Chief Complaint: shortness of breath  HPI: Sean FillersDonald Carlson is a 80 y.o. male  With h/o dementia, HTN, From ALF with dyspnea and leg edema for about a week.  Unable to provide much history. But per son, no reported cough or fever. CXR shows CHF cannot rule out infiltrate v. atelectasis. proBNP >2000. WBC normal and afebrile  Review of Systems:  Limited due to patient factors  Past Medical History  Diagnosis Date  . Hypertension   . Dementia   . Osteoarthritis    Past Surgical History  Procedure Laterality Date  . Appendectomy     Social History:  reports that he has never smoked. He does not have any smokeless tobacco history on file. He reports that he drinks alcohol. He reports that he does not use illicit drugs.  No Known Allergies  FH: heart disease   Prior to Admission medications   Medication Sig Start Date End Date Taking? Authorizing Provider  donepezil (ARICEPT) 10 MG tablet Take 10 mg by mouth at bedtime.   Yes Historical Provider, Carlson  Dutasteride-Tamsulosin HCl (JALYN) 0.5-0.4 MG CAPS Take 1 capsule by mouth daily.   Yes Historical Provider, Carlson  lisinopril (PRINIVIL,ZESTRIL) 5 MG tablet Take 5 mg by mouth daily.   Yes Historical Provider, Carlson  loperamide (IMODIUM) 2 MG capsule Take 1 capsule (2 mg total) by mouth 4 (four) times daily as needed for diarrhea or loose stools. 01/10/14  Yes Sean Munchobert Lockwood, Carlson  meloxicam (MOBIC) 15 MG tablet Take 15 mg by mouth daily.   Yes Historical Provider, Carlson  memantine (NAMENDA) 10 MG tablet Take 10 mg by mouth 2 (two) times daily.   Yes Historical Provider, Carlson   Physical Exam: Filed Vitals:   03/19/14 1545  BP: 151/94  Pulse: 72  Temp:   Resp:     BP 151/94  Pulse 72  Temp(Src) 98.3 F (36.8 C) (Temporal)  Resp 28  Ht 5\' 10"  (1.778 m)  SpO2 98% BP 141/80  Pulse 61   Temp(Src) 98.3 F (36.8 C) (Temporal)  Resp 28  Ht 5\' 10"  (1.778 m)  SpO2 100%  General Appearance:    Alert, cooperative, won't answer questions reliably. Slightly tachypneic  Head:    Normocephalic, without obvious abnormality, atraumatic  Eyes:    PERRL, conjunctiva/corneas clear, EOM's intact, fundi    benign, both eyes          Nose:   Nares normal, septum midline, mucosa normal, no drainage   or sinus tenderness  Throat:   Lips, mucosa, and tongue normal; teeth and gums normal  Neck:   Supple, distended neck veins. No thyromegaly or LA.   Back:     Symmetric, no curvature, ROM normal, no CVA tenderness  Lungs:     Rales at bases  Chest wall:    No tenderness or deformity  Heart:    Fast, no MGR. regular  Abdomen:     Soft, non-tender, bowel sounds active all four quadrants,    no masses, no organomegaly  Genitalia:   deferred  Rectal:   deferred  Extremities:   R>L pitting edema  Pulses:   2+ and symmetric all extremities  Skin:   Excoriations noted on back and trunk  Lymph nodes:   Cervical, supraclavicular, and axillary nodes normal  Neurologic:   CNII-XII intact. Normal strength, sensation and reflexes  throughout           Psych: calm and cooperative  Labs on Admission:  Basic Metabolic Panel:  Recent Labs Lab 03/19/14 1433  NA 140  K 4.3  CL 103  CO2 24  GLUCOSE 130*  BUN 35*  CREATININE 0.91  CALCIUM 8.7   Liver Function Tests: No results found for this basename: AST, ALT, ALKPHOS, BILITOT, PROT, ALBUMIN,  in the last 168 hours No results found for this basename: LIPASE, AMYLASE,  in the last 168 hours No results found for this basename: AMMONIA,  in the last 168 hours CBC:  Recent Labs Lab 03/19/14 1433  WBC 7.7  NEUTROABS 4.1  HGB 15.3  HCT 45.6  MCV 104.3*  PLT 149*   Cardiac Enzymes:  Recent Labs Lab 03/19/14 1433  TROPONINI <0.30    BNP (last 3 results)  Recent Labs  03/19/14 1433  PROBNP 2813.0*   CBG: No results  found for this basename: GLUCAP,  in the last 168 hours  Radiological Exams on Admission: Dg Chest Portable 1 View  03/19/2014   CLINICAL DATA:  Shortness of breath  EXAM: PORTABLE CHEST - 1 VIEW  COMPARISON:  January 10, 2014  FINDINGS: The mediastinal contour is normal. The heart size is enlarged. There is interstitial pulmonary edema. There are small bilateral pleural effusions. Patchy consolidation of both lung bases are noted. These osseous structures are stable.  IMPRESSION: Congestive heart failure. Small bilateral pleural effusions. Consolidation of both lung bases in part due to atelectasis but superimposed pneumonia is not excluded.   Electronically Signed   By: Sherian ReinWei-Chen  Lin M.Carlson.   On: 03/19/2014 14:58    EKG: NSR. Wandering baseline. Old anterior  infarct  Assessment/Plan Principal Problem:   Congestive heart failure, new. Admit to tele. Bid lasix. Check echo, TSH. Daily weights. Right leg more edematous than left. Will get dopplers. Active Problems:   Dementia   Hypertension: continue ace inhibitor   Code Status: DNR per son Family Communication: son Disposition Plan: ALF Time spent: 60 min  Esperanza Madrazo L [facility] Pager [phone]

## 2014-03-20 DIAGNOSIS — R0602 Shortness of breath: Secondary | ICD-10-CM

## 2014-03-20 DIAGNOSIS — F03918 Unspecified dementia, unspecified severity, with other behavioral disturbance: Secondary | ICD-10-CM

## 2014-03-20 DIAGNOSIS — R609 Edema, unspecified: Secondary | ICD-10-CM

## 2014-03-20 DIAGNOSIS — F0391 Unspecified dementia with behavioral disturbance: Secondary | ICD-10-CM

## 2014-03-20 LAB — BASIC METABOLIC PANEL
ANION GAP: 15 (ref 5–15)
BUN: 34 mg/dL — ABNORMAL HIGH (ref 6–23)
CHLORIDE: 99 meq/L (ref 96–112)
CO2: 28 meq/L (ref 19–32)
CREATININE: 1.16 mg/dL (ref 0.50–1.35)
Calcium: 8.8 mg/dL (ref 8.4–10.5)
GFR calc Af Amer: 64 mL/min — ABNORMAL LOW (ref >90)
GFR calc non Af Amer: 55 mL/min — ABNORMAL LOW (ref >90)
Glucose, Bld: 84 mg/dL (ref 70–99)
Potassium: 4.1 mEq/L (ref 3.7–5.3)
Sodium: 142 mEq/L (ref 137–147)

## 2014-03-20 MED ORDER — METOPROLOL TARTRATE 12.5 MG HALF TABLET
12.5000 mg | ORAL_TABLET | Freq: Two times a day (BID) | ORAL | Status: DC
  Administered 2014-03-20: 12.5 mg via ORAL
  Filled 2014-03-20 (×2): qty 1

## 2014-03-20 MED ORDER — HALOPERIDOL LACTATE 5 MG/ML IJ SOLN: 0.5000 mg | Freq: Three times a day (TID) | INTRAMUSCULAR | Status: DC | PRN

## 2014-03-20 MED ORDER — METOPROLOL TARTRATE 25 MG PO TABS
25.0000 mg | ORAL_TABLET | Freq: Four times a day (QID) | ORAL | Status: DC
  Administered 2014-03-20 – 2014-03-23 (×12): 25 mg via ORAL
  Filled 2014-03-20 (×16): qty 1

## 2014-03-20 NOTE — Progress Notes (Signed)
UR completed Khaliyah [employer] K. Luberta Grabinski, RN, BSN, MSHL, CCM  03/20/2014 9:03 AM

## 2014-03-20 NOTE — Progress Notes (Signed)
Patient confused, pulling off telemetry leads and gown.  MD ordered PRN haldol, have not administered as patient has fallen asleep.. Will continue to monitor.  Also made on-call MD aware of patient's heart rate sustaining 120-128 throughout the night and since admission to the unit around 1700. No further instructions given.

## 2014-03-20 NOTE — Progress Notes (Addendum)
Primary Physician:  Polite Primary Cardiologist:   HPI: patinet is an 80 yo with history of HTN and dementia.  Admitted with dyspnea and edeam x 1 wk.  Poor historian.  Patient is DNR  Patient is a poor historian  He denies CP  Doesn't say much about breathing.  Does not appear to be hurting anywhere.          Past Medical History  Diagnosis Date  . Hypertension   . Dementia   . Osteoarthritis     Medications Prior to Admission  Medication Sig Dispense Refill  . donepezil (ARICEPT) 10 MG tablet Take 10 mg by mouth at bedtime.      . Dutasteride-Tamsulosin HCl (JALYN) 0.5-0.4 MG CAPS Take 1 capsule by mouth daily.      Marland Kitchen. lisinopril (PRINIVIL,ZESTRIL) 5 MG tablet Take 5 mg by mouth daily.      Marland Kitchen. loperamide (IMODIUM) 2 MG capsule Take 1 capsule (2 mg total) by mouth 4 (four) times daily as needed for diarrhea or loose stools.  12 capsule  0  . meloxicam (MOBIC) 15 MG tablet Take 15 mg by mouth daily.      . memantine (NAMENDA) 10 MG tablet Take 10 mg by mouth 2 (two) times daily.         Marland Kitchen. antiseptic oral rinse  15 mL Mouth Rinse BID  . docusate sodium  100 mg Oral BID  . donepezil  10 mg Oral QHS  . dutasteride  0.5 mg Oral Daily  . enoxaparin (LOVENOX) injection  40 mg Subcutaneous Q24H  . furosemide  40 mg Intravenous BID  . lisinopril  5 mg Oral Daily  . memantine  10 mg Oral BID  . metoprolol tartrate  12.5 mg Oral BID  . pneumococcal 23 valent vaccine  0.5 mL Intramuscular Tomorrow-1000  . potassium chloride  20 mEq Oral Daily  . tamsulosin  0.4 mg Oral Daily    Infusions:    No Known Allergies  History   Social History  . Marital Status: Widowed    Spouse Name: N/A    Number of Children: N/A  . Years of Education: N/A   Occupational History  . Not on file.   Social History Main Topics  . Smoking status: Never Smoker   . Smokeless tobacco: Not on file  . Alcohol Use: Yes     Comment: occasionally  . Drug Use: No  . Sexual Activity: Not on  file   Other Topics Concern  . Not on file   Social History Narrative  . No narrative on file    History reviewed. No pertinent family history.  REVIEW OF SYSTEMS:  All systems reviewed  Negative to the above problem except as noted above.    PHYSICAL EXAM: Filed Vitals:   03/20/14 0623  BP: 150/98  Pulse: 122  Temp: 98 F (36.7 C)  Resp: 20     Intake/Output Summary (Last 24 hours) at 03/20/14 0852 Last data filed at 03/20/14 0844  Gross per 24 hour  Intake    480 ml  Output   3175 ml  Net  -2695 ml    General:  Elderly 80 yo in NAD HEENT: normal Neck: JVP difficult to assess  Cor: PMI nondisplaced. Regular rate & rhythm. No rubs, gallops or murmurs. Lungs: Rel clear.   Abdomen: soft, nontender, nondistended. No hepatosplenomegaly. No bruits or masses. Good bowel sounds. Extremities: no cyanosis, clubbing,  1+ edema.   Neuro: Patinet knows  in hospital  [place].  ECG:  Atrial flutter with 2:1 block  Septal MI  ST depression, cannot exclude ischemia.  (ST changes present on previous ECG)  Results for orders placed during the hospital encounter of 03/19/14 (from the past 24 hour(s))  PRO B NATRIURETIC PEPTIDE     Status: Abnormal   Collection Time    03/19/14  2:33 PM      Result Value Ref Range   Pro B Natriuretic peptide (BNP) 2813.0 (*) 0 - 450 pg/mL  BASIC METABOLIC PANEL     Status: Abnormal   Collection Time    03/19/14  2:33 PM      Result Value Ref Range   Sodium 140  137 - 147 mEq/L   Potassium 4.3  3.7 - 5.3 mEq/L   Chloride 103  96 - 112 mEq/L   CO2 24  19 - 32 mEq/L   Glucose, Bld 130 (*) 70 - 99 mg/dL   BUN 35 (*) 6 - 23 mg/dL   Creatinine, Ser 9.520.91  0.50 - 1.35 mg/dL   Calcium 8.7  8.4 - 84.110.5 mg/dL   GFR calc non Af Amer 75 (*) >90 mL/min   GFR calc Af Amer 86 (*) >90 mL/min   Anion gap 13  5 - 15  TROPONIN I     Status: None   Collection Time    03/19/14  2:33 PM      Result Value Ref Range   Troponin I <0.30  <0.30 ng/mL  CBC WITH  DIFFERENTIAL     Status: Abnormal   Collection Time    03/19/14  2:33 PM      Result Value Ref Range   WBC 7.7  4.0 - 10.5 K/uL   RBC 4.37  4.22 - 5.81 MIL/uL   Hemoglobin 15.3  13.0 - 17.0 g/dL   HCT 32.445.6  40.139.0 - 02.752.0 %   MCV 104.3 (*) 78.0 - 100.0 fL   MCH 35.0 (*) 26.0 - 34.0 pg   MCHC 33.6  30.0 - 36.0 g/dL   RDW 25.313.7  66.411.5 - 40.315.5 %   Platelets 149 (*) 150 - 400 K/uL   Neutrophils Relative % 54  43 - 77 %   Neutro Abs 4.1  1.7 - 7.7 K/uL   Lymphocytes Relative 30  12 - 46 %   Lymphs Abs 2.3  0.7 - 4.0 K/uL   Monocytes Relative 10  3 - 12 %   Monocytes Absolute 0.8  0.1 - 1.0 K/uL   Eosinophils Relative 6 (*) 0 - 5 %   Eosinophils Absolute 0.4  0.0 - 0.7 K/uL   Basophils Relative 0  0 - 1 %   Basophils Absolute 0.0  0.0 - 0.1 K/uL  TSH     Status: None   Collection Time    03/19/14  2:51 PM      Result Value Ref Range   TSH 2.490  0.350 - 4.500 uIU/mL  BASIC METABOLIC PANEL     Status: Abnormal   Collection Time    03/20/14  4:03 AM      Result Value Ref Range   Sodium 142  137 - 147 mEq/L   Potassium 4.1  3.7 - 5.3 mEq/L   Chloride 99  96 - 112 mEq/L   CO2 28  19 - 32 mEq/L   Glucose, Bld 84  70 - 99 mg/dL   BUN 34 (*) 6 - 23 mg/dL   Creatinine, Ser 4.741.16  0.50 - 1.35 mg/dL   Calcium 8.8  8.4 - 16.1 mg/dL   GFR calc non Af Amer 55 (*) >90 mL/min   GFR calc Af Amer 64 (*) >90 mL/min   Anion gap 15  5 - 15   Dg Chest Portable 1 View  03/19/2014   CLINICAL DATA:  Shortness of breath  EXAM: PORTABLE CHEST - 1 VIEW  COMPARISON:  January 10, 2014  FINDINGS: The mediastinal contour is normal. The heart size is enlarged. There is interstitial pulmonary edema. There are small bilateral pleural effusions. Patchy consolidation of both lung bases are noted. These osseous structures are stable.  IMPRESSION: Congestive heart failure. Small bilateral pleural effusions. Consolidation of both lung bases in part due to atelectasis but superimposed pneumonia is not excluded.    Electronically Signed   By: Sherian Rein M.D.   On: 03/19/2014 14:58     ASSESSMENT: Patient is an 80 yo who presents with new onset SOB and edema On exam, evid of volume overload.  EKG with atrial flutter which is new.  This may be what is exacerbating above.    Recomm Diruese.  Lopressor for rate control for flutter.  I do not think patient is a candidate for anticoagulation with dementia (he was trying to climb out of bed, didn't know foley was in) Echo to evalaute.  I would hold of on ACE I unt follow BP more.    Will continue to follow  Family not around for discussion.    2.  HTN  Follow.    3.  Dementia.  Follow.  Patient is DNR.

## 2014-03-20 NOTE — Progress Notes (Signed)
Report received from off going nurse.  Noted pt in bed asleep call bell at reach and bed alarm set.  Will continue to monitor.  Sean Carlson, Charity fundraiserN.

## 2014-03-20 NOTE — Progress Notes (Signed)
Subjective: Pt awake Denies SOB/ CP Overnight some confusion noted.  Objective: Vital signs in last 24 hours: Temp:  [98 F (36.7 C)-98.3 F (36.8 C)] 98 F (36.7 C) (07/03 0623) Pulse Rate:  [56-126] 122 (07/03 0623) Resp:  [18-28] 20 (07/03 0623) BP: (141-158)/(64-100) 150/98 mmHg (07/03 0623) SpO2:  [92 %-100 %] 98 % (07/03 0623) Weight:  [82.19 kg (181 lb 3.1 oz)-83.9 kg (184 lb 15.5 oz)] 82.19 kg (181 lb 3.1 oz) (07/03 16100623) Weight change:  Last BM Date: 03/19/14  Intake/Output from previous day: 07/02 0701 - 07/03 0700 In: 240 [P.O.:240] Out: 3175 [Urine:3175] Intake/Output this shift:    General appearance: alert Resp: rales bibasilar and wheezes bilaterally Cardio: tachycardic Extremities: edema right leg worse then left  Lab Results:  Recent Labs  03/19/14 1433  WBC 7.7  HGB 15.3  HCT 45.6  PLT 149*   BMET  Recent Labs  03/19/14 1433 03/20/14 0403  NA 140 142  K 4.3 4.1  CL 103 99  CO2 24 28  GLUCOSE 130* 84  BUN 35* 34*  CREATININE 0.91 1.16  CALCIUM 8.7 8.8    Studies/Results: Dg Chest Portable 1 View  03/19/2014   CLINICAL DATA:  Shortness of breath  EXAM: PORTABLE CHEST - 1 VIEW  COMPARISON:  January 10, 2014  FINDINGS: The mediastinal contour is normal. The heart size is enlarged. There is interstitial pulmonary edema. There are small bilateral pleural effusions. Patchy consolidation of both lung bases are noted. These osseous structures are stable.  IMPRESSION: Congestive heart failure. Small bilateral pleural effusions. Consolidation of both lung bases in part due to atelectasis but superimposed pneumonia is not excluded.   Electronically Signed   By: Sherian ReinWei-Chen  Lin M.D.   On: 03/19/2014 14:58    Medications: I have reviewed the patient's current medications.  Assessment/Plan: Principal Problem:  Congestive heart failure, with pleural effusion-overnight good diuresis-  _2.9 L continue lasix IV.  Check echo,  Daily weights. Right leg more  edematous than left. - check  dopplers. - Cardiology consult- medical management given his age and dementia Repeat Chest xray and BNP Renal function and K is ok Tachycardia with RBBB-  Add low dose BB Active Problems:  Dementia - mild intermittent confusion-  Hypertension: continue ace inhibitor   LOS: 1 day   Skyley Grandmaison 03/20/2014, 8:16 AM

## 2014-03-20 NOTE — Clinical Social Work Psychosocial (Signed)
Clinical Social Work Department BRIEF PSYCHOSOCIAL ASSESSMENT 03/20/2014  Patient:  Sean Carlson, Sean Carlson     Account Number:  [id number]     Admit date:  03/19/2014  Clinical Social Worker:  Lovey Newcomer  Date/Time:  03/20/2014 11:31 AM  Referred by:  Physician  Date Referred:  03/20/2014 Referred for  ALF Placement  SNF Placement   Other Referral:   Interview type:  Family Other interview type:   CSW spoke with patient and son at bedside.    PSYCHOSOCIAL DATA Living Status:  FACILITY Admitted from facility:  [facility] Level of care:  Assisted Living Primary support name:  Lysbeth Galas Primary support relationship to patient:  CHILD, ADULT Degree of support available:   Support is good.    CURRENT CONCERNS Current Concerns  Post-Acute Placement   Other Concerns:    SOCIAL WORK ASSESSMENT / PLAN CSW met with patient and son at bedside to complete assessment. Patient's son Lysbeth Galas confirms that the patient is from [facility] and has resided there since December of 2014. Patient was at [facility] prior. Patient's son plans for patient to DC back to [facility] when ready. CSW explained that PT will work with patient and inquired about whether or not family would be open to SNF if recommended by patient. Son would like patient to return to [facility] if possible but is agreeable to the patient going to SNF if necessary. CSW explained SNF search process and answered questions.   Assessment/plan status:  Psychosocial Support/Ongoing Assessment of Needs Other assessment/ plan:   Complete Fl2, Fax, PASRR   Information/referral to community resources:   CSW notified patient's son that he can ask nurse to contact CSW if family needs to speak with CSW.    PATIENT'S/FAMILY'S RESPONSE TO PLAN OF CARE: Patient's son Lysbeth Galas plans for patient to DC back to [facility] if possible, but is open to SNF placement if necessary. Patient's son appears  calm and appreciative of CSW assistance.       Liz Beach MSW, [place], [place], [phone]

## 2014-03-20 NOTE — Progress Notes (Signed)
PT Cancellation Note  Patient Details Name: Sean FillersDonald Kennebrew MRN: [id number] DOB: [birth year]   Cancelled Treatment:    Reason Eval/Treat Not Completed: Patient not medically ready. Still awaiting results of LE doppler for ?DVT.   Ahmod Gillespie 03/20/2014, 2:18 PM

## 2014-03-20 NOTE — Progress Notes (Signed)
Per EKG today and cardiology, pt in Atril flutter. Pt continues in Atrial flutter, rate controlled 60-80s. Will continue to monitor. Huel Coventryosenberger, Lannah Koike A, RN

## 2014-03-20 NOTE — Progress Notes (Signed)
PT Cancellation Note  Patient Details Name: Sean Carlson MRN: [id number] DOB: 10/14/1925   Cancelled Treatment:    Reason Eval Not Completed: Patient not medically ready--dopplers to rule out LE DVT are pending. Await results prior to initiating PT evaluation.   Adira Limburg 03/20/2014, 9:49 AM Pager [phone]

## 2014-03-21 ENCOUNTER — Inpatient Hospital Stay (HOSPITAL_COMMUNITY): Payer: Medicare Other

## 2014-03-21 DIAGNOSIS — I359 Nonrheumatic aortic valve disorder, unspecified: Secondary | ICD-10-CM

## 2014-03-21 DIAGNOSIS — R609 Edema, unspecified: Secondary | ICD-10-CM

## 2014-03-21 DIAGNOSIS — R0602 Shortness of breath: Secondary | ICD-10-CM

## 2014-03-21 LAB — BASIC METABOLIC PANEL
ANION GAP: 16 — AB (ref 5–15)
BUN: 33 mg/dL — ABNORMAL HIGH (ref 6–23)
CO2: 26 meq/L (ref 19–32)
Calcium: 8.9 mg/dL (ref 8.4–10.5)
Chloride: 98 mEq/L (ref 96–112)
Creatinine, Ser: 1.04 mg/dL (ref 0.50–1.35)
GFR calc Af Amer: 73 mL/min — ABNORMAL LOW (ref >90)
GFR calc non Af Amer: 63 mL/min — ABNORMAL LOW (ref >90)
GLUCOSE: 84 mg/dL (ref 70–99)
POTASSIUM: 4.1 meq/L (ref 3.7–5.3)
SODIUM: 140 meq/L (ref 137–147)

## 2014-03-21 LAB — PRO B NATRIURETIC PEPTIDE: Pro B Natriuretic peptide (BNP): 2719 pg/mL — ABNORMAL HIGH (ref 0–450)

## 2014-03-21 MED ORDER — LEVALBUTEROL HCL 0.63 MG/3ML IN NEBU
0.6300 mg | INHALATION_SOLUTION | Freq: Four times a day (QID) | RESPIRATORY_TRACT | Status: DC | PRN
  Administered 2014-03-21 – 2014-03-22 (×2): 0.63 mg via RESPIRATORY_TRACT
  Filled 2014-03-21 (×2): qty 3

## 2014-03-21 NOTE — Evaluation (Signed)
Physical Therapy Evaluation Patient Details Name: Sean FillersDonald Carlson MRN: [id number] DOB: [birth year] Today's Date: 03/21/2014   History of Present Illness  Adm 03/19/14 with dyspnea. Acute CHF, atrial flutter. Doppler negative DVT bil LEs PMHx- dementia, HTN, OA  Clinical Impression  Pt admitted with CHF. Pt resides in an ALF and currently requires min assist for balance (with RW) to walk. Pt shuffles his feet and tripped over his own shoes numerous times. He was able to correct his gait and improve his foot clearance with instructional cues, however as he fatigued he would begin to shuffle and "catch his toe" (on both feet). Unsure if this is pt's baseline, however typically at ALF pt's need to be at a supervision level of care. Pt currently needs min assist, therefore feel he needs further therapy prior to return to ALF. Pt currently with functional limitations due to the deficits listed below (see PT Problem List).  Pt will benefit from skilled PT to increase their independence and safety with mobility to allow discharge to the venue listed below.       Follow Up Recommendations CIR (pt has shown ability to follow instructions and make changes with gait pattern, however fatigues and reverts back to shuffling gait with losses of balance). If the staff at ALF feel this is baseline and they can provide minimal assist when he walks, then he could return to ALF.    Equipment Recommendations  None recommended by PT    Recommendations for Other Services OT consult     Precautions / Restrictions Precautions Precautions: Fall Restrictions Weight Bearing Restrictions: No      Mobility  Bed Mobility Overal bed mobility: Modified Independent             General bed mobility comments: HOB elevated, however no assist needed  Transfers Overall transfer level: Needs assistance Equipment used: Rolling walker (2 wheeled) Transfers: Sit to/from Stand Sit to Stand: Supervision         General  transfer comment: supervision for safety due to recent diuresis of 5L; denied dizziness; used proper technique  Ambulation/Gait Ambulation/Gait assistance: Min assist Ambulation Distance (Feet): 200 Feet Assistive device: Rolling walker (2 wheeled) Gait Pattern/deviations: Step-through pattern;Decreased stride length;Shuffle;Drifts right/left;Trunk flexed   Gait velocity interpretation: at or above normal speed for age/gender General Gait Details: Donned pt's sneakers (per his request), however multiple times pt caught his toe causing loss of balance (bil feet, Rt>Lt). Decreased when pt stayed closer to the RW and was not shuffling his feet. When he pushed RW too far ahead with flexed torso, he began to shuffle more and would trip on his own shoes.  Stairs            Wheelchair Mobility    Modified Rankin (Stroke Patients Only)       Balance Overall balance assessment: Needs assistance Sitting-balance support: No upper extremity supported;Feet supported Sitting balance-Leahy Scale: Normal     Standing balance support: No upper extremity supported Standing balance-Leahy Scale: Fair                               Pertinent Vitals/Pain SaO2 on 2L 97% at rest, 99% on 2L walking, 97% on RA walking, 98% at rest on RA HR 70-80s Denied pain    Home Living Family/patient expects to be discharged to:: Assisted living               Home Equipment: Dan HumphreysWalker - 2  wheels Additional Comments: pt reports he only uses oxygen when he sleeps    Prior Function Level of Independence: Needs assistance   Gait / Transfers Assistance Needed: supervision for safety           Hand Dominance        Extremity/Trunk Assessment   Upper Extremity Assessment: Overall WFL for tasks assessed           Lower Extremity Assessment: Generalized weakness      Cervical / Trunk Assessment: Kyphotic  Communication   Communication: HOH  Cognition Arousal/Alertness:  Awake/alert Behavior During Therapy: WFL for tasks assessed/performed Overall Cognitive Status: No family/caregiver present to determine baseline cognitive functioning                      General Comments      Exercises        Assessment/Plan    PT Assessment Patient needs continued PT services  PT Diagnosis Difficulty walking   PT Problem List Decreased strength;Decreased balance;Decreased mobility;Decreased knowledge of use of DME;Decreased safety awareness  PT Treatment Interventions DME instruction;Gait training;Functional mobility training;Therapeutic activities;Therapeutic exercise;Balance training;Patient/family education   PT Goals (Current goals can be found in the Care Plan section) Acute Rehab PT Goals Patient Stated Goal: unable to state goal, agrees he wants to get stronger PT Goal Formulation: With patient Time For Goal Achievement: 03/28/14 Potential to Achieve Goals: Good    Frequency Min 3X/week   Barriers to discharge Decreased caregiver support ? if staff at his ALF will/can provide min assist while walking    Co-evaluation               End of Session Equipment Utilized During Treatment: Gait belt;Oxygen Activity Tolerance: Patient tolerated treatment well Patient left: in chair;with call bell/phone within reach;with chair alarm set Nurse Communication: Mobility status;Other (comment) (O2 removed and SaO2 99% on RA)         Time: [id number] PT Time Calculation (min): 29 min   Charges:   PT Evaluation $Initial PT Evaluation Tier I: 1 Procedure PT Treatments $Gait Training: 8-22 mins   PT G Codes:          Andrez Lieurance 03/21/2014, 10:48 AM Pager [phone]

## 2014-03-21 NOTE — Progress Notes (Signed)
PT Cancellation Note  Patient Details Name: Sean Carlson MRN: [id number] DOB: [birth year]   Cancelled Treatment:    Reason Eval/Treat Not Completed: Patient not medically ready. Still awaiting dopplers to r/o DVT. Spoke with RN and she has already been trying to get the test scheduled. Unclear why it has not been done she stated.   Jacory Kamel 03/21/2014, 8:19 AM Pager [phone]

## 2014-03-21 NOTE — Progress Notes (Signed)
Subjective: Patient is comfortable but confused.  Conversive and calm.  Diuresis has been excellent And I and Os are Negative 2 liters since yesterday and 2.9 liters the night before for total of 5 liters of diuresis in 48 hours.  Objective: Weight change: -15 lb 1.1 oz (-6.834 kg)  Intake/Output Summary (Last 24 hours) at 03/21/14 0858 Last data filed at 03/21/14 0843  Gross per 24 hour  Intake    720 ml  Output   2575 ml  Net  -1855 ml   Filed Vitals:   03/21/14 0121 03/21/14 0138 03/21/14 0425 03/21/14 0625  BP: 138/94  150/92 135/93  Pulse:  103 95 94  Temp:  97.4 F (36.3 C)  97.3 F (36.3 C)  TempSrc:  Oral  Oral  Resp:    18  Height:      Weight:    169 lb 14.4 oz (77.066 kg)  SpO2:  99%  100%    General Appearance: Alert, cooperative, no distress, appears stated age Lungs: Minimal rales in bases bilaterally Heart: Regular rate and rhythm, S1 and S2 normal, no murmur, rub or gallop Abdomen: Soft, non-tender, bowel sounds active all four quadrants, no masses, no organomegaly Extremities: Extremities normal, atraumatic, no cyanosis or edema Neuro: Alert and confused, nonfocal  Lab Results: Results for orders placed during the hospital encounter of 03/19/14 (from the past 48 hour(s))  PRO B NATRIURETIC PEPTIDE     Status: Abnormal   Collection Time    03/19/14  2:33 PM      Result Value Ref Range   Pro B Natriuretic peptide (BNP) 2813.0 (*) 0 - 450 pg/mL  BASIC METABOLIC PANEL     Status: Abnormal   Collection Time    03/19/14  2:33 PM      Result Value Ref Range   Sodium 140  137 - 147 mEq/L   Potassium 4.3  3.7 - 5.3 mEq/L   Chloride 103  96 - 112 mEq/L   CO2 24  19 - 32 mEq/L   Glucose, Bld 130 (*) 70 - 99 mg/dL   BUN 35 (*) 6 - 23 mg/dL   Creatinine, Ser 0.91  0.50 - 1.35 mg/dL   Calcium 8.7  8.4 - 10.5 mg/dL   GFR calc non Af Amer 75 (*) >90 mL/min   GFR calc Af Amer 86 (*) >90 mL/min   Comment: (NOTE)     The eGFR has been calculated using the CKD  EPI equation.     This calculation has not been validated in all clinical situations.     eGFR's persistently <90 mL/min signify possible Chronic Kidney     Disease.   Anion gap 13  5 - 15  TROPONIN I     Status: None   Collection Time    03/19/14  2:33 PM      Result Value Ref Range   Troponin I <0.30  <0.30 ng/mL   Comment:            Due to the release kinetics of cTnI,     a negative result within the first hours     of the onset of symptoms does not rule out     myocardial infarction with certainty.     If myocardial infarction is still suspected,     repeat the test at appropriate intervals.  CBC WITH DIFFERENTIAL     Status: Abnormal   Collection Time    03/19/14  2:33 PM  Result Value Ref Range   WBC 7.7  4.0 - 10.5 K/uL   RBC 4.37  4.22 - 5.81 MIL/uL   Hemoglobin 15.3  13.0 - 17.0 g/dL   HCT 45.6  39.0 - 52.0 %   MCV 104.3 (*) 78.0 - 100.0 fL   MCH 35.0 (*) 26.0 - 34.0 pg   MCHC 33.6  30.0 - 36.0 g/dL   RDW 13.7  11.5 - 15.5 %   Platelets 149 (*) 150 - 400 K/uL   Neutrophils Relative % 54  43 - 77 %   Neutro Abs 4.1  1.7 - 7.7 K/uL   Lymphocytes Relative 30  12 - 46 %   Lymphs Abs 2.3  0.7 - 4.0 K/uL   Monocytes Relative 10  3 - 12 %   Monocytes Absolute 0.8  0.1 - 1.0 K/uL   Eosinophils Relative 6 (*) 0 - 5 %   Eosinophils Absolute 0.4  0.0 - 0.7 K/uL   Basophils Relative 0  0 - 1 %   Basophils Absolute 0.0  0.0 - 0.1 K/uL  TSH     Status: None   Collection Time    03/19/14  2:51 PM      Result Value Ref Range   TSH 2.490  0.350 - 4.500 uIU/mL  BASIC METABOLIC PANEL     Status: Abnormal   Collection Time    03/20/14  4:03 AM      Result Value Ref Range   Sodium 142  137 - 147 mEq/L   Potassium 4.1  3.7 - 5.3 mEq/L   Chloride 99  96 - 112 mEq/L   CO2 28  19 - 32 mEq/L   Glucose, Bld 84  70 - 99 mg/dL   BUN 34 (*) 6 - 23 mg/dL   Creatinine, Ser 1.16  0.50 - 1.35 mg/dL   Calcium 8.8  8.4 - 10.5 mg/dL   GFR calc non Af Amer 55 (*) >90 mL/min   GFR  calc Af Amer 64 (*) >90 mL/min   Comment: (NOTE)     The eGFR has been calculated using the CKD EPI equation.     This calculation has not been validated in all clinical situations.     eGFR's persistently <90 mL/min signify possible Chronic Kidney     Disease.   Anion gap 15  5 - 15  BASIC METABOLIC PANEL     Status: Abnormal   Collection Time    03/21/14  4:30 AM      Result Value Ref Range   Sodium 140  137 - 147 mEq/L   Potassium 4.1  3.7 - 5.3 mEq/L   Chloride 98  96 - 112 mEq/L   CO2 26  19 - 32 mEq/L   Glucose, Bld 84  70 - 99 mg/dL   BUN 33 (*) 6 - 23 mg/dL   Creatinine, Ser 1.04  0.50 - 1.35 mg/dL   Calcium 8.9  8.4 - 10.5 mg/dL   GFR calc non Af Amer 63 (*) >90 mL/min   GFR calc Af Amer 73 (*) >90 mL/min   Comment: (NOTE)     The eGFR has been calculated using the CKD EPI equation.     This calculation has not been validated in all clinical situations.     eGFR's persistently <90 mL/min signify possible Chronic Kidney     Disease.   Anion gap 16 (*) 5 - 15  PRO B NATRIURETIC PEPTIDE     Status:  Abnormal   Collection Time    03/21/14  4:30 AM      Result Value Ref Range   Pro B Natriuretic peptide (BNP) 2719.0 (*) 0 - 450 pg/mL    Studies/Results: Dg Chest 1 View  03/21/2014   CLINICAL DATA:  Shortness of breath.  CHF.  EXAM: CHEST - 1 VIEW  COMPARISON:  03/19/2012.  FINDINGS: Stable enlarged cardiac silhouette. Prominent pulmonary vasculature and interstitial markings with mild improvement. Small bilateral pleural effusions, also improved. Stable dense left lower lobe opacity.  IMPRESSION: 1. Mildly improved changes of congestive heart failure. 2. Stable cardiomegaly and dense left lower lobe atelectasis or pneumonia.   Electronically Signed   By: Enrique Sack M.D.   On: 03/21/2014 07:30   Dg Chest Portable 1 View  03/19/2014   CLINICAL DATA:  Shortness of breath  EXAM: PORTABLE CHEST - 1 VIEW  COMPARISON:  January 10, 2014  FINDINGS: The mediastinal contour is normal. The  heart size is enlarged. There is interstitial pulmonary edema. There are small bilateral pleural effusions. Patchy consolidation of both lung bases are noted. These osseous structures are stable.  IMPRESSION: Congestive heart failure. Small bilateral pleural effusions. Consolidation of both lung bases in part due to atelectasis but superimposed pneumonia is not excluded.   Electronically Signed   By: Abelardo Diesel M.D.   On: 03/19/2014 14:58   Medications: Scheduled Meds: . antiseptic oral rinse  15 mL Mouth Rinse BID  . docusate sodium  100 mg Oral BID  . donepezil  10 mg Oral QHS  . dutasteride  0.5 mg Oral Daily  . enoxaparin (LOVENOX) injection  40 mg Subcutaneous Q24H  . furosemide  40 mg Intravenous BID  . memantine  10 mg Oral BID  . metoprolol tartrate  25 mg Oral QID  . potassium chloride  20 mEq Oral Daily  . tamsulosin  0.4 mg Oral Daily   Continuous Infusions:  PRN Meds:.acetaminophen, acetaminophen, alum & mag hydroxide-simeth, ondansetron (ZOFRAN) IV, ondansetron, senna-docusate  Assessment/Plan: Principal Problem:  Congestive heart failure clinically improved with pleural effusions - repeat chest x-ray today, portable, again good overnight diuresis- _2.0 L 4 total of 5 liters in 48 hours.  Patient has not started to become more azotemic.  Expect transition to by mouth Lasix would be reasonable soon.  Defer to cardiology.  Continue lasix IV.  Check echo, Daily weights.  His legs are much improved with just trace edema bilaterally. - Cardiology following- medical management given his age and dementia.  Preliminary Dopplers of the lower extremities are looking negative but formal report to follow  Repeat Chest xray and BNP  Renal function and potassium are stable Tachycardia with RBBB- continue low dose BB  Active Problems:  Dementia - mild intermittent confusion-  Hypertension: continue ace inhibitor   LOS: 2 days   Henrine Screws, MD 03/21/2014, 8:58 AM

## 2014-03-21 NOTE — Progress Notes (Signed)
Patient ID: Sean Carlson, male   DOB: 20-Jul-1926, 80 y.o.   MRN: [id number]   Patient Name: Sean FillersDonald Carlson Date of Encounter: 03/21/2014     Principal Problem:   Congestive heart failure Active Problems:   Dementia   Hypertension    SUBJECTIVE No chest pain or sob. No palpitations.   CURRENT MEDS . antiseptic oral rinse  15 mL Mouth Rinse BID  . docusate sodium  100 mg Oral BID  . donepezil  10 mg Oral QHS  . dutasteride  0.5 mg Oral Daily  . enoxaparin (LOVENOX) injection  40 mg Subcutaneous Q24H  . furosemide  40 mg Intravenous BID  . memantine  10 mg Oral BID  . metoprolol tartrate  25 mg Oral QID  . potassium chloride  20 mEq Oral Daily  . tamsulosin  0.4 mg Oral Daily    OBJECTIVE  Filed Vitals:   03/21/14 0121 03/21/14 0138 03/21/14 0425 03/21/14 0625  BP: 138/94  150/92 135/93  Pulse:  103 95 94  Temp:  97.4 F (36.3 C)  97.3 F (36.3 C)  TempSrc:  Oral  Oral  Resp:    18  Height:      Weight:    169 lb 14.4 oz (77.066 kg)  SpO2:  99%  100%    Intake/Output Summary (Last 24 hours) at 03/21/14 0929 Last data filed at 03/21/14 [id number]  Gross per 24 hour  Intake    720 ml  Output   2775 ml  Net  -2055 ml   Filed Weights   03/19/14 1718 03/20/14 0623 03/21/14 0625  Weight: 184 lb 15.5 oz (83.9 kg) 181 lb 3.1 oz (82.19 kg) 169 lb 14.4 oz (77.066 kg)    PHYSICAL EXAM  General: Pleasant, elderly man, NAD. Neuro: Alert and oriented X 3. Moves all extremities spontaneously. Psych: Normal affect. HEENT:  Normal  Neck: Supple without bruits or JVD. Lungs:  Resp regular and unlabored, CTA except for basilar rales. Heart: IRRR no s3, s4, or murmurs. Abdomen: Soft, non-tender, non-distended, BS + x 4.  Extremities: No clubbing, cyanosis; trace peripheral edema. DP/PT/Radials 2+ and equal bilaterally.  Accessory Clinical Findings  CBC  Recent Labs  03/19/14 1433  WBC 7.7  NEUTROABS 4.1  HGB 15.3  HCT 45.6  MCV 104.3*  PLT 149*   Basic Metabolic  Panel  Recent Labs  64/40/3405/11/30 0403 03/21/14 0430  NA 142 140  K 4.1 4.1  CL 99 98  CO2 28 26  GLUCOSE 84 84  BUN 34* 33*  CREATININE 1.16 1.04  CALCIUM 8.8 8.9   Liver Function Tests No results found for this basename: AST, ALT, ALKPHOS, BILITOT, PROT, ALBUMIN,  in the last 72 hours No results found for this basename: LIPASE, AMYLASE,  in the last 72 hours Cardiac Enzymes  Recent Labs  03/19/14 1433  TROPONINI <0.30   BNP No components found with this basename: POCBNP,  D-Dimer No results found for this basename: DDIMER,  in the last 72 hours Hemoglobin A1C No results found for this basename: HGBA1C,  in the last 72 hours Fasting Lipid Panel No results found for this basename: CHOL, HDL, LDLCALC, TRIG, CHOLHDL, LDLDIRECT,  in the last 72 hours Thyroid Function Tests  Recent Labs  03/19/14 1451  TSH 2.490    TELE  Atrial flutter with a CVR  Radiology/Studies  Dg Chest 1 View  03/21/2014   CLINICAL DATA:  Shortness of breath.  CHF.  EXAM: CHEST - 1 VIEW  COMPARISON:  03/19/2012.  FINDINGS: Stable enlarged cardiac silhouette. Prominent pulmonary vasculature and interstitial markings with mild improvement. Small bilateral pleural effusions, also improved. Stable dense left lower lobe opacity.  IMPRESSION: 1. Mildly improved changes of congestive heart failure. 2. Stable cardiomegaly and dense left lower lobe atelectasis or pneumonia.   Electronically Signed   By: Gordan PaymentSteve  Reid M.D.   On: 03/21/2014 07:30   Dg Chest Portable 1 View  03/19/2014   CLINICAL DATA:  Shortness of breath  EXAM: PORTABLE CHEST - 1 VIEW  COMPARISON:  January 10, 2014  FINDINGS: The mediastinal contour is normal. The heart size is enlarged. There is interstitial pulmonary edema. There are small bilateral pleural effusions. Patchy consolidation of both lung bases are noted. These osseous structures are stable.  IMPRESSION: Congestive heart failure. Small bilateral pleural effusions. Consolidation of both  lung bases in part due to atelectasis but superimposed pneumonia is not excluded.   Electronically Signed   By: Sherian ReinWei-Chen  Lin M.D.   On: 03/19/2014 14:58    ASSESSMENT AND PLAN 1. Acute on chronic heart failure - his symptoms are well controlled and nearing baseline. We do not have an echo. Ordered on 7/2 but not done. Will continue diuresis as renal function allows. 2. Atrial flutter - his rate is under better control. He is not a candidate for DCCV or ablation as not a candidate for anti-coagulation. Rate control with beta blocker is indicated.  Shia Delaine,M.D.  03/21/2014 9:29 AM

## 2014-03-21 NOTE — Progress Notes (Signed)
VASCULAR LAB PRELIMINARY  PRELIMINARY  PRELIMINARY  PRELIMINARY  Bilateral lower extremity venous Dopplers completed.    Preliminary report:  There is no DVT or SVT noted in the bilateral lower extremities.   Tamea Bai, RVT 03/21/2014, 9:11 AM

## 2014-03-21 NOTE — Progress Notes (Signed)
  Echocardiogram 2D Echocardiogram has been performed.  Leta JunglingCooper, Alvenia Treese M 03/21/2014, 1:00 PM

## 2014-03-21 NOTE — Progress Notes (Signed)
MD paged as patient audibly wheezing, lung sounds consistent with AM assessment, clear and diminished.  Patient denies shortness of breath, oxygen saturation 98% on room air.  PRN nebulizer treatment administered by RN as RT unavailable.  Will continue to monitor.

## 2014-03-22 DIAGNOSIS — R7989 Other specified abnormal findings of blood chemistry: Secondary | ICD-10-CM | POA: Diagnosis not present

## 2014-03-22 DIAGNOSIS — R5381 Other malaise: Secondary | ICD-10-CM | POA: Diagnosis present

## 2014-03-22 DIAGNOSIS — J9801 Acute bronchospasm: Secondary | ICD-10-CM | POA: Diagnosis not present

## 2014-03-22 DIAGNOSIS — R918 Other nonspecific abnormal finding of lung field: Secondary | ICD-10-CM | POA: Diagnosis present

## 2014-03-22 LAB — BASIC METABOLIC PANEL
ANION GAP: 14 (ref 5–15)
BUN: 43 mg/dL — ABNORMAL HIGH (ref 6–23)
CO2: 28 meq/L (ref 19–32)
Calcium: 8.9 mg/dL (ref 8.4–10.5)
Chloride: 97 mEq/L (ref 96–112)
Creatinine, Ser: 1.18 mg/dL (ref 0.50–1.35)
GFR calc Af Amer: 63 mL/min — ABNORMAL LOW (ref >90)
GFR, EST NON AFRICAN AMERICAN: 54 mL/min — AB (ref >90)
Glucose, Bld: 141 mg/dL — ABNORMAL HIGH (ref 70–99)
Potassium: 3.8 mEq/L (ref 3.7–5.3)
SODIUM: 139 meq/L (ref 137–147)

## 2014-03-22 LAB — PRO B NATRIURETIC PEPTIDE: PRO B NATRI PEPTIDE: 2567 pg/mL — AB (ref 0–450)

## 2014-03-22 MED ORDER — FUROSEMIDE 40 MG PO TABS
40.0000 mg | ORAL_TABLET | Freq: Two times a day (BID) | ORAL | Status: DC
  Administered 2014-03-22 – 2014-03-24 (×5): 40 mg via ORAL
  Filled 2014-03-22 (×6): qty 1

## 2014-03-22 NOTE — Progress Notes (Signed)
Patient ID: Sean Carlson [place], male   DOB: 03-08-1926, 80 y.o.   MRN: [id number]    Patient Name: Sean Carlson [place] Date of Encounter: 03/22/2014     Principal Problem:   Congestive heart failure Active Problems:   Dementia   Hypertension    SUBJECTIVE No chest pain or sob. No palpitations. He had an episode of wheezing last night.  CURRENT MEDS . antiseptic oral rinse  15 mL Mouth Rinse BID  . docusate sodium  100 mg Oral BID  . donepezil  10 mg Oral QHS  . dutasteride  0.5 mg Oral Daily  . enoxaparin (LOVENOX) injection  40 mg Subcutaneous Q24H  . furosemide  40 mg Intravenous BID  . memantine  10 mg Oral BID  . metoprolol tartrate  25 mg Oral QID  . potassium chloride  20 mEq Oral Daily  . tamsulosin  0.4 mg Oral Daily    OBJECTIVE  Filed Vitals:   03/21/14 2053 03/22/14 0257 03/22/14 0403 03/22/14 0928  BP: 140/67  150/86 128/72  Pulse: 75  84 81  Temp: 97.9 F (36.6 C)  97.3 F (36.3 C)   TempSrc: Oral  Oral   Resp: 19  18 18   Height:      Weight:   169 lb 12.1 oz (77 kg)   SpO2: 97% 96% 96% 99%    Intake/Output Summary (Last 24 hours) at 03/22/14 0936 Last data filed at 03/21/14 2315  Gross per 24 hour  Intake    480 ml  Output   1150 ml  Net   -670 ml   Filed Weights   03/20/14 0623 03/21/14 0625 03/22/14 0403  Weight: 181 lb 3.1 oz (82.19 kg) 169 lb 14.4 oz (77.066 kg) 169 lb 12.1 oz (77 kg)    PHYSICAL EXAM  General: Pleasant, elderly man, NAD. Neuro: Alert and oriented X 3. Moves all extremities spontaneously. Psych: Normal affect. HEENT:  Normal  Neck: Supple without bruits or JVD. Lungs:  Resp regular and unlabored, CTA except for basilar rales. Heart: IRRR no s3, s4, or murmurs. Abdomen: Soft, non-tender, non-distended, BS + x 4.  Extremities: No clubbing, cyanosis; trace peripheral edema. DP/PT/Radials 2+ and equal bilaterally.  Accessory Clinical Findings  CBC  Recent Labs  03/19/14 1433  WBC 7.7  NEUTROABS 4.1  HGB 15.3  HCT 45.6  MCV  104.3*  PLT 149*   Basic Metabolic Panel  Recent Labs  03/21/14 0430 03/22/14 0315  NA 140 139  K 4.1 3.8  CL 98 97  CO2 26 28  GLUCOSE 84 141*  BUN 33* 43*  CREATININE 1.04 1.18  CALCIUM 8.9 8.9   Liver Function Tests No results found for this basename: AST, ALT, ALKPHOS, BILITOT, PROT, ALBUMIN,  in the last 72 hours No results found for this basename: LIPASE, AMYLASE,  in the last 72 hours Cardiac Enzymes  Recent Labs  03/19/14 1433  TROPONINI <0.30   BNP No components found with this basename: POCBNP,  D-Dimer No results found for this basename: DDIMER,  in the last 72 hours Hemoglobin A1C No results found for this basename: HGBA1C,  in the last 72 hours Fasting Lipid Panel No results found for this basename: CHOL, HDL, LDLCALC, TRIG, CHOLHDL, LDLDIRECT,  in the last 72 hours Thyroid Function Tests  Recent Labs  03/19/14 1451  TSH 2.490    TELE  Atrial flutter with a CVR  Radiology/Studies  Dg Chest 1 View  03/21/2014   CLINICAL DATA:  Shortness of breath.  CHF.  EXAM: CHEST - 1 VIEW  COMPARISON:  03/19/2012.  FINDINGS: Stable enlarged cardiac silhouette. Prominent pulmonary vasculature and interstitial markings with mild improvement. Small bilateral pleural effusions, also improved. Stable dense left lower lobe opacity.  IMPRESSION: 1. Mildly improved changes of congestive heart failure. 2. Stable cardiomegaly and dense left lower lobe atelectasis or pneumonia.   Electronically Signed   By: Gordan PaymentSteve  Reid M.D.   On: 03/21/2014 07:30   Dg Chest Portable 1 View  03/19/2014   CLINICAL DATA:  Shortness of breath  EXAM: PORTABLE CHEST - 1 VIEW  COMPARISON:  January 10, 2014  FINDINGS: The mediastinal contour is normal. The heart size is enlarged. There is interstitial pulmonary edema. There are small bilateral pleural effusions. Patchy consolidation of both lung bases are noted. These osseous structures are stable.  IMPRESSION: Congestive heart failure. Small bilateral  pleural effusions. Consolidation of both lung bases in part due to atelectasis but superimposed pneumonia is not excluded.   Electronically Signed   By: Sherian ReinWei-Chen  Lin M.D.   On: 03/19/2014 14:58    ASSESSMENT AND PLAN 1. Acute on chronic heart failure - his symptoms are well controlled and he appears to be euvolemic. We do not have an echo. Ordered on 7/2 but not done. Will switch to oral diuretic and follow renal function. 2. Atrial flutter - his rate is under better control. He is not a candidate for DCCV or ablation as not a candidate for anti-coagulation. Rate control with beta blocker is indicated. 3. Disp. - note plans for inpatient rehab tomorrow. Lukasz Rogus,M.D.  03/22/2014 9:36 AM

## 2014-03-22 NOTE — Progress Notes (Signed)
Patient alert and oriented x3-4 throughout shift (intermittently disoriented to time and/or situation).  Son updated on plan of care via telephone this AM.  Patient and son deny any questions or concerns at this time.  Will continue to monitor.

## 2014-03-22 NOTE — Progress Notes (Addendum)
Subjective: Sean Carlson experiencing wheezing last night but lungs are clear today.  Responded to 1 Xopenex low-dose nebulizer treatment.  Patient is somewhat azotemic today but clinically euvolemic.  Will switch to by mouth Lasix and seek inpatient rehabilitation.  -630 cc and past 24 hours on IV Lasix, greater than 6.5 liters negative over the past several days.  Objective: Weight change: -2.3 oz (-0.066 kg)  Intake/Output Summary (Last 24 hours) at 03/22/14 0938 Last data filed at 03/21/14 2315  Gross per 24 hour  Intake    480 ml  Output   1150 ml  Net   -670 ml   Filed Vitals:   03/21/14 2053 03/22/14 0257 03/22/14 0403 03/22/14 0928  BP: 140/67  150/86 128/72  Pulse: 75  84 81  Temp: 97.9 F (36.6 C)  97.3 F (36.3 C)   TempSrc: Oral  Oral   Resp: _0 Height:      Weight:   169 lb 12.1 oz (77 kg)   SpO2: 97% 96% 96% 99%    General Appearance: Alert, cooperative, no distress, appears stated age Lungs: Clear to auscultation bilaterally, respirations unlabored Heart: Regular rate and rhythm, S1 and S2 normal, no murmur, rub or gallop Abdomen: Soft, non-tender, bowel sounds active all four quadrants, no masses, no organomegaly Extremities: Extremities normal, atraumatic, no cyanosis or edema Neuro: Oriented x2, nonfocal  Lab Results: Results for orders placed during the hospital encounter of 03/19/14 (from the past 48 hour(s))  BASIC METABOLIC PANEL     Status: Abnormal   Collection Time    03/21/14  4:30 AM      Result Value Ref Range   Sodium 140  137 - 147 mEq/L   Potassium 4.1  3.7 - 5.3 mEq/L   Chloride 98  96 - 112 mEq/L   CO2 26  19 - 32 mEq/L   Glucose, Bld 84  70 - 99 mg/dL   BUN 33 (*) 6 - 23 mg/dL   Creatinine, Ser 1.04  0.50 - 1.35 mg/dL   Calcium 8.9  8.4 - 10.5 mg/dL   GFR calc non Af Amer 63 (*) >90 mL/min   GFR calc Af Amer 73 (*) >90 mL/min   Comment: (NOTE)     The eGFR has been calculated using the CKD EPI equation.     This calculation has  not been validated in all clinical situations.     eGFR's persistently <90 mL/min signify possible Chronic Kidney     Disease.   Anion gap 16 (*) 5 - 15  PRO B NATRIURETIC PEPTIDE     Status: Abnormal   Collection Time    03/21/14  4:30 AM      Result Value Ref Range   Pro B Natriuretic peptide (BNP) 2719.0 (*) 0 - 450 pg/mL  BASIC METABOLIC PANEL     Status: Abnormal   Collection Time    03/22/14  3:15 AM      Result Value Ref Range   Sodium 139  137 - 147 mEq/L   Potassium 3.8  3.7 - 5.3 mEq/L   Chloride 97  96 - 112 mEq/L   CO2 28  19 - 32 mEq/L   Glucose, Bld 141 (*) 70 - 99 mg/dL   BUN 43 (*) 6 - 23 mg/dL   Creatinine, Ser 1.18  0.50 - 1.35 mg/dL   Calcium 8.9  8.4 - 10.5 mg/dL   GFR calc non Af Amer 54 (*) >90 mL/min  GFR calc Af Amer 63 (*) >90 mL/min   Comment: (NOTE)     The eGFR has been calculated using the CKD EPI equation.     This calculation has not been validated in all clinical situations.     eGFR's persistently <90 mL/min signify possible Chronic Kidney     Disease.   Anion gap 14  5 - 15  PRO B NATRIURETIC PEPTIDE     Status: Abnormal   Collection Time    03/22/14  3:15 AM      Result Value Ref Range   Pro B Natriuretic peptide (BNP) 2567.0 (*) 0 - 450 pg/mL    Studies/Results: Dg Chest 1 View  03/21/2014   CLINICAL DATA:  Shortness of breath.  CHF.  EXAM: CHEST - 1 VIEW  COMPARISON:  03/19/2012.  FINDINGS: Stable enlarged cardiac silhouette. Prominent pulmonary vasculature and interstitial markings with mild improvement. Small bilateral pleural effusions, also improved. Stable dense left lower lobe opacity.  IMPRESSION: 1. Mildly improved changes of congestive heart failure. 2. Stable cardiomegaly and dense left lower lobe atelectasis or pneumonia.   Electronically Signed   By: Enrique Sack M.D.   On: 03/21/2014 07:30   Dg Chest Port 1 View  03/21/2014   CLINICAL DATA:  Shortness of breath.  CHF.  EXAM: PORTABLE CHEST - 1 VIEW  COMPARISON:  Earlier today.   FINDINGS: Stable enlarged cardiac silhouette. Mildly decreased dense left lower lobe airspace opacity. Decreased prominence of the pulmonary vasculature and interstitial markings. Decreased size of small bilateral pleural effusions. Diffuse osteopenia, bilateral shoulder degenerative changes and thoracic spine degenerative changes. Left shoulder loose bodies.  IMPRESSION: 1. Mildly improved dense left lower lobe atelectasis or pneumonia. 2. Further improvement in changes of congestive heart failure.   Electronically Signed   By: Enrique Sack M.D.   On: 03/21/2014 09:47   Medications: Scheduled Meds: . antiseptic oral rinse  15 mL Mouth Rinse BID  . docusate sodium  100 mg Oral BID  . donepezil  10 mg Oral QHS  . dutasteride  0.5 mg Oral Daily  . enoxaparin (LOVENOX) injection  40 mg Subcutaneous Q24H  . furosemide  40 mg Oral BID  . memantine  10 mg Oral BID  . metoprolol tartrate  25 mg Oral QID  . potassium chloride  20 mEq Oral Daily  . tamsulosin  0.4 mg Oral Daily   Continuous Infusions:  PRN Meds:.acetaminophen, acetaminophen, alum & mag hydroxide-simeth, levalbuterol, ondansetron (ZOFRAN) IV, ondansetron, senna-docusate  Assessment/Plan:  Principal Problem:  Congestive heart failure clinically improved with pleural effusions - chest x-ray yesterday shows decrease in dense left lower lobe opacity and decrease in pleural effusions bilaterally - now about 6 liters negative over the past several days. Patient has become more azotemic.  Transition to by mouth Lasix today.  Discussed with Dr. Lovena Le.  Weight is down 15 pounds.  2-D echo with EF of 28-76% with diastolic dysfunction. Bronchospasm - responded to low-dose Xopenex last night.  Use when necessary.  Lungs clear today Lower extremity venous Dopplers negative for DVT Renal function and potassium are okay though azotemic.  Switch to by mouth Lasix as above Tachycardia resolved RBBB - aware LLL density - possible atelectasis, doubt  pneumonia, follow Dementia - mild intermittent confusion Hypertension: continue ace inhibitor Activity/Disposition - continue physical therapy and hopefully transition to inpatient rehabilitation   LOS: 3 days   Henrine Screws, MD 03/22/2014, 9:38 AM

## 2014-03-23 DIAGNOSIS — R5381 Other malaise: Secondary | ICD-10-CM

## 2014-03-23 DIAGNOSIS — I4892 Unspecified atrial flutter: Secondary | ICD-10-CM

## 2014-03-23 DIAGNOSIS — I509 Heart failure, unspecified: Secondary | ICD-10-CM

## 2014-03-23 LAB — BASIC METABOLIC PANEL
ANION GAP: 13 (ref 5–15)
BUN: 45 mg/dL — AB (ref 6–23)
CALCIUM: 8.7 mg/dL (ref 8.4–10.5)
CHLORIDE: 100 meq/L (ref 96–112)
CO2: 28 mEq/L (ref 19–32)
Creatinine, Ser: 1.07 mg/dL (ref 0.50–1.35)
GFR, EST AFRICAN AMERICAN: 70 mL/min — AB (ref >90)
GFR, EST NON AFRICAN AMERICAN: 61 mL/min — AB (ref >90)
Glucose, Bld: 94 mg/dL (ref 70–99)
Potassium: 4.3 mEq/L (ref 3.7–5.3)
Sodium: 141 mEq/L (ref 137–147)

## 2014-03-23 LAB — CBC WITH DIFFERENTIAL/PLATELET
BASOS PCT: 0 % (ref 0–1)
Basophils Absolute: 0 10*3/uL (ref 0.0–0.1)
EOS ABS: 0.3 10*3/uL (ref 0.0–0.7)
Eosinophils Relative: 4 % (ref 0–5)
HCT: 47.1 % (ref 39.0–52.0)
Hemoglobin: 15.6 g/dL (ref 13.0–17.0)
Lymphocytes Relative: 35 % (ref 12–46)
Lymphs Abs: 2.7 10*3/uL (ref 0.7–4.0)
MCH: 34.4 pg — AB (ref 26.0–34.0)
MCHC: 33.1 g/dL (ref 30.0–36.0)
MCV: 103.7 fL — ABNORMAL HIGH (ref 78.0–100.0)
MONOS PCT: 11 % (ref 3–12)
Monocytes Absolute: 0.8 10*3/uL (ref 0.1–1.0)
NEUTROS ABS: 3.8 10*3/uL (ref 1.7–7.7)
Neutrophils Relative %: 50 % (ref 43–77)
PLATELETS: 156 10*3/uL (ref 150–400)
RBC: 4.54 MIL/uL (ref 4.22–5.81)
RDW: 13.4 % (ref 11.5–15.5)
WBC: 7.7 10*3/uL (ref 4.0–10.5)

## 2014-03-23 LAB — PRO B NATRIURETIC PEPTIDE: Pro B Natriuretic peptide (BNP): 2646 pg/mL — ABNORMAL HIGH (ref 0–450)

## 2014-03-23 MED ORDER — NEBIVOLOL HCL 2.5 MG PO TABS
2.5000 mg | ORAL_TABLET | Freq: Every day | ORAL | Status: DC
  Administered 2014-03-23 – 2014-03-24 (×2): 2.5 mg via ORAL
  Filled 2014-03-23 (×2): qty 1

## 2014-03-23 NOTE — Progress Notes (Signed)
Looks as if pt is aspirating while eating breakfast.  MD made aware, no new orders placed, will continue to monitor.

## 2014-03-23 NOTE — Progress Notes (Signed)
Physical Therapy Treatment Patient Details Name: Sean Carlson MRN: [id number] DOB: 01-21-1926 Today's Date: 03/23/2014    History of Present Illness Adm 03/19/14 with dyspnea. Acute CHF, atrial flutter. Doppler negative DVT bil LEs PMHx- dementia, HTN, OA    PT Comments    Pt admitted with above. Pt currently with functional limitations due to balance and endurance deficits.  Pt will benefit from skilled PT to increase their independence and safety with mobility to allow discharge to the venue listed below.   Follow Up Recommendations  CIR     Equipment Recommendations  None recommended by PT    Recommendations for Other Services       Precautions / Restrictions Precautions Precautions: Fall Restrictions Weight Bearing Restrictions: No    Mobility  Bed Mobility Overal bed mobility: Modified Independent             General bed mobility comments: HOB elevated, however no assist needed.  Took incr time for pt to acheive EOB.    Transfers Overall transfer level: Needs assistance Equipment used: Rolling walker (2 wheeled) Transfers: Sit to/from Stand Sit to Stand: Min guard         General transfer comment: min guard assist due to safety.  Took incr time for pt to get to standing.   Ambulation/Gait Ambulation/Gait assistance: Min assist Ambulation Distance (Feet): 250 Feet Assistive device: Rolling walker (2 wheeled) Gait Pattern/deviations: Step-through pattern;Shuffle;Decreased stride length;Drifts right/left;Trunk flexed   Gait velocity interpretation: at or above normal speed for age/gender General Gait Details: Pt did not ask to don sneakers therefore just let him walk in hospital socks.  Pt did not catch toes at all with socks only.   Needed cues to stay close to the RW. Needed cues to stand upright as well as take bigger steps as pt shuffles his feet.  When he pushed RW too far ahead with flexed torso, he began to shuffle more.  Needed steadying assist  throughout.     Stairs            Wheelchair Mobility    Modified Rankin (Stroke Patients Only)       Balance Overall balance assessment: Needs assistance;History of Falls Sitting-balance support: No upper extremity supported;Feet supported Sitting balance-Leahy Scale: Normal     Standing balance support: Bilateral upper extremity supported;During functional activity Standing balance-Leahy Scale: Poor Standing balance comment: Needed UE support to maintain balance.               High level balance activites: Turns;Direction changes High Level Balance Comments: Needed min assist for balance with high level activities.     Cognition Arousal/Alertness: Awake/alert Behavior During Therapy: WFL for tasks assessed/performed Overall Cognitive Status: No family/caregiver present to determine baseline cognitive functioning                      Exercises General Exercises - Lower Extremity Ankle Circles/Pumps: AROM;Both;10 reps;Seated Long Arc Quad: AROM;Both;10 reps;Seated Hip Flexion/Marching: AROM;Both;10 reps;Seated    General Comments        Pertinent Vitals/Pain VSS with sats 96% on RA with ambulation.  Other VSS, No pain.     Home Living                      Prior Function            PT Goals (current goals can now be found in the care plan section) Progress towards PT goals: Progressing toward goals  Frequency  Min 3X/week    PT Plan Current plan remains appropriate    Co-evaluation             End of Session Equipment Utilized During Treatment: Gait belt;Oxygen Activity Tolerance: Patient tolerated treatment well Patient left: in chair;with call bell/phone within reach;with chair alarm set     Time: [id number] PT Time Calculation (min): 27 min  Charges:  $Gait Training: 8-22 mins $Therapeutic Exercise: 8-22 mins                    G Codes:      Carlson,Sean Schumm 03/23/2014, 11:42 AM Sean Carlson,PT Acute  Rehabilitation [phone] (pager)

## 2014-03-23 NOTE — Progress Notes (Signed)
CSW aware of need for increased level of care to SNF and process has been initiated. Fl2 on chart for MD's signature. Will follow up with patient and son in the a.m to discuss SNF placement process.  Blue [employer] Berkley Harveyauth will be required prior to SNF placement.  MD: Please advise re: tentative d/c date. Thanks!  Lorri Frederickonna T. West PughCrowder, LCSWA  [phone]

## 2014-03-23 NOTE — Progress Notes (Signed)
Subjective: Patient pleasantly confused, unable to give any meaningful history. constantly has copious amounts of spitting. Chart review, there've been concerns about wheezing. Beta blocker is new.  Objective: Vital signs in last 24 hours: Temp:  [97.5 F (36.4 C)-98.2 F (36.8 C)] 97.6 F (36.4 C) (07/06 0559) Pulse Rate:  [61-91] 66 (07/06 0559) Resp:  [18-22] 20 (07/06 0559) BP: (125-148)/(64-91) 128/78 mmHg (07/06 0559) SpO2:  [91 %-100 %] 100 % (07/06 0559) Weight:  [76.92 kg (169 lb 9.3 oz)] 76.92 kg (169 lb 9.3 oz) (07/06 0559) Weight change: -0.08 kg (-2.8 oz) Last BM Date: 03/23/14  Intake/Output from previous day: 07/05 0701 - 07/06 0700 In: 720 [P.O.:720] Out: 1185 [Urine:1185] Intake/Output this shift: Total I/O In: 240 [P.O.:240] Out: -   General appearance: pleasantly confused no apparent distress Resp: some expiratory wheeze, moderate air movement. Somewhat difficult exam due to compliance issues Cardio: regular rate and rhythm Extremities: extremities normal, atraumatic, no cyanosis or edema  Lab Results:  Results for orders placed during the hospital encounter of 03/19/14 (from the past 24 hour(s))  BASIC METABOLIC PANEL     Status: Abnormal   Collection Time    03/23/14  2:30 AM      Result Value Ref Range   Sodium 141  137 - 147 mEq/L   Potassium 4.3  3.7 - 5.3 mEq/L   Chloride 100  96 - 112 mEq/L   CO2 28  19 - 32 mEq/L   Glucose, Bld 94  70 - 99 mg/dL   BUN 45 (*) 6 - 23 mg/dL   Creatinine, Ser 1.611.07  0.50 - 1.35 mg/dL   Calcium 8.7  8.4 - 09.610.5 mg/dL   GFR calc non Af Amer 61 (*) >90 mL/min   GFR calc Af Amer 70 (*) >90 mL/min   Anion gap 13  5 - 15  CBC WITH DIFFERENTIAL     Status: Abnormal   Collection Time    03/23/14  2:30 AM      Result Value Ref Range   WBC 7.7  4.0 - 10.5 K/uL   RBC 4.54  4.22 - 5.81 MIL/uL   Hemoglobin 15.6  13.0 - 17.0 g/dL   HCT 04.547.1  40.939.0 - 81.152.0 %   MCV 103.7 (*) 78.0 - 100.0 fL   MCH 34.4 (*) 26.0 - 34.0 pg   MCHC 33.1  30.0 - 36.0 g/dL   RDW 91.413.4  78.211.5 - 95.615.5 %   Platelets 156  150 - 400 K/uL   Neutrophils Relative % 50  43 - 77 %   Neutro Abs 3.8  1.7 - 7.7 K/uL   Lymphocytes Relative 35  12 - 46 %   Lymphs Abs 2.7  0.7 - 4.0 K/uL   Monocytes Relative 11  3 - 12 %   Monocytes Absolute 0.8  0.1 - 1.0 K/uL   Eosinophils Relative 4  0 - 5 %   Eosinophils Absolute 0.3  0.0 - 0.7 K/uL   Basophils Relative 0  0 - 1 %   Basophils Absolute 0.0  0.0 - 0.1 K/uL  PRO B NATRIURETIC PEPTIDE     Status: Abnormal   Collection Time    03/23/14  2:30 AM      Result Value Ref Range   Pro B Natriuretic peptide (BNP) 2646.0 (*) 0 - 450 pg/mL      Studies/Results: Dg Chest Port 1 View  03/21/2014   CLINICAL DATA:  Shortness of breath.  CHF.  EXAM:  PORTABLE CHEST - 1 VIEW  COMPARISON:  Earlier today.  FINDINGS: Stable enlarged cardiac silhouette. Mildly decreased dense left lower lobe airspace opacity. Decreased prominence of the pulmonary vasculature and interstitial markings. Decreased size of small bilateral pleural effusions. Diffuse osteopenia, bilateral shoulder degenerative changes and thoracic spine degenerative changes. Left shoulder loose bodies.  IMPRESSION: 1. Mildly improved dense left lower lobe atelectasis or pneumonia. 2. Further improvement in changes of congestive heart failure.   Electronically Signed   By: Gordan PaymentSteve  Reid M.D.   On: 03/21/2014 09:47    Medications:  I have reviewed the patient's current medications. Prior to Admission:  Prescriptions prior to admission  Medication Sig Dispense Refill  . donepezil (ARICEPT) 10 MG tablet Take 10 mg by mouth at bedtime.      . Dutasteride-Tamsulosin HCl (JALYN) 0.5-0.4 MG CAPS Take 1 capsule by mouth daily.      Marland Kitchen. lisinopril (PRINIVIL,ZESTRIL) 5 MG tablet Take 5 mg by mouth daily.      Marland Kitchen. loperamide (IMODIUM) 2 MG capsule Take 1 capsule (2 mg total) by mouth 4 (four) times daily as needed for diarrhea or loose stools.  12 capsule  0  .  meloxicam (MOBIC) 15 MG tablet Take 15 mg by mouth daily.      . memantine (NAMENDA) 10 MG tablet Take 10 mg by mouth 2 (two) times daily.       Scheduled: . antiseptic oral rinse  15 mL Mouth Rinse BID  . docusate sodium  100 mg Oral BID  . donepezil  10 mg Oral QHS  . dutasteride  0.5 mg Oral Daily  . enoxaparin (LOVENOX) injection  40 mg Subcutaneous Q24H  . furosemide  40 mg Oral BID  . memantine  10 mg Oral BID  . metoprolol tartrate  25 mg Oral QID  . potassium chloride  20 mEq Oral Daily  . tamsulosin  0.4 mg Oral Daily   Continuous:  NWG:[id number]:acetaminophen, acetaminophen, alum & mag hydroxide-simeth, levalbuterol, ondansetron (ZOFRAN) IV, ondansetron, senna-docusate  Assessment/Plan: CHF in the setting of newly documented atrial flutter. Rate is controlled. Continue management per cardiology. Agree patient not candidate for anticoagulations. As there is concern for beta blocker causing bronchospasm consider change to diltiazem Dementia, DO NOT RESUSCITATE . Patient has copious amounts of drooling and spitting. This is not new. BPH  Abnormal x-ray showing left lower lobe atelectasis, improvement in changes of CHF. Previous outpatient x-rays have showed left lower lobe atelectasis or scarring. Disposition pending further recommendations by cardiology    LOS: 4 days   Amada Hallisey D 03/23/2014, 9:20 AM

## 2014-03-23 NOTE — Progress Notes (Signed)
Inpatient Rehabilitation  I note that Dr. Riley KillSwartz has completed the PM&R consult and he is recommending pt. for SNF level care and therapies. I have notified  CM and SW accordingly.  Please call if questions.  I will sign off.    Weldon PickingSusan Raynie Steinhaus PT Inpatient Rehab Admissions Coordinator Cell [phone] Office [phone]

## 2014-03-23 NOTE — Consult Note (Signed)
Physical Medicine and Rehabilitation Consult  Reason for Consult: Gait disorder Referring Physician: Dr. Kevan NyGates.    HPI: Sean Carlson is a 80 y.o. male with history of HTN, dementia, OA; who was admitted on 03/19/14 with SOB X 3-4 days as well as elevated proBNP>2000. He was treated with diuretics for fluid overload and 2D echo done revealing EF 40% with diffuse hypokinesis. EKG revealed new onset of A flutter and cardiology felt that this may be exacerbating symptoms. Patient not a candidate for anticoagulation due to dementia and high fall risk.  Lopressor added for rate control. BLE dopplers negative for DVT. Patient has been confused and disoriented during hospitalization. PT evaluation reveals shuffling gait with flexed posture and tendency to trip over his shoes especially with fatigue. MD, PT recommending CIR.   Nursing reports problems with GERD with concerns of aspiration.   Review of Systems  Unable to perform ROS: mental acuity    Past Medical History  Diagnosis Date  . Hypertension   . Dementia   . Osteoarthritis    Past Surgical History  Procedure Laterality Date  . Appendectomy     History reviewed. No pertinent family history.  Social History:  Lives at ALF. Per  reports that he has never smoked. He does not have any smokeless tobacco history on file. Per reports that he drinks alcohol. Per  reports that he does not use illicit drugs.  Allergies: No Known Allergies   Medications Prior to Admission  Medication Sig Dispense Refill  . donepezil (ARICEPT) 10 MG tablet Take 10 mg by mouth at bedtime.      . Dutasteride-Tamsulosin HCl (JALYN) 0.5-0.4 MG CAPS Take 1 capsule by mouth daily.      Marland Kitchen. lisinopril (PRINIVIL,ZESTRIL) 5 MG tablet Take 5 mg by mouth daily.      Marland Kitchen. loperamide (IMODIUM) 2 MG capsule Take 1 capsule (2 mg total) by mouth 4 (four) times daily as needed for diarrhea or loose stools.  12 capsule  0  . meloxicam (MOBIC) 15 MG tablet Take 15 mg by  mouth daily.      . memantine (NAMENDA) 10 MG tablet Take 10 mg by mouth 2 (two) times daily.        Home: Home Living Family/patient expects to be discharged to:: Assisted living Living Arrangements: Spouse/significant other Home Equipment: Walker - 2 wheels Additional Comments: pt reports he only uses oxygen when he sleeps  Functional History: Prior Function Level of Independence: Needs assistance Gait / Transfers Assistance Needed: supervision for safety Functional Status:  Mobility: Bed Mobility Overal bed mobility: Modified Independent General bed mobility comments: HOB elevated, however no assist needed Transfers Overall transfer level: Needs assistance Equipment used: Rolling walker (2 wheeled) Transfers: Sit to/from Stand Sit to Stand: Supervision General transfer comment: supervision for safety due to recent diuresis of 5L; denied dizziness; used proper technique Ambulation/Gait Ambulation/Gait assistance: Min assist Ambulation Distance (Feet): 200 Feet Assistive device: Rolling walker (2 wheeled) Gait Pattern/deviations: Step-through pattern;Decreased stride length;Shuffle;Drifts right/left;Trunk flexed Gait velocity interpretation: at or above normal speed for age/gender General Gait Details: Donned pt's sneakers (per his request), however multiple times pt caught his toe causing loss of balance (bil feet, Rt>Lt). Decreased when pt stayed closer to the RW and was not shuffling his feet. When he pushed RW too far ahead with flexed torso, he began to shuffle more and would trip on his own shoes.    ADL:    Cognition: Cognition Overall Cognitive Status:  No family/caregiver present to determine baseline cognitive functioning Orientation Level: Oriented to person;Oriented to place;Oriented to time;Oriented to situation (pt with history of sundowning) Cognition Arousal/Alertness: Awake/alert Behavior During Therapy: WFL for tasks assessed/performed Overall Cognitive  Status: No family/caregiver present to determine baseline cognitive functioning  Blood pressure 128/78, pulse 66, temperature 97.6 F (36.4 C), temperature source Oral, resp. rate 20, height 5\' 10"  (1.778 m), weight 76.92 kg (169 lb 9.3 oz), SpO2 100.00%. Physical Exam  Nursing note and vitals reviewed. Constitutional: He appears well-developed. He has a sickly appearance. Nasal cannula in place.  Frail elderly male with frequent regurgitation resulting in vomiting of breakfast mixed with thick tenacious respiratory secretions.   HENT:  Head: Normocephalic and atraumatic.  Eyes: Conjunctivae are normal. Pupils are equal, round, and reactive to light.  Neck: Normal range of motion.  Cardiovascular: Normal rate.   Respiratory: Effort normal. No respiratory distress. He has wheezes.  Prolonged expiratory phase. Upper airway sounds heard  GI: Soft. Bowel sounds are normal. He exhibits no distension. There is no tenderness.  Musculoskeletal: He exhibits no edema.  Neurological: He is alert.  Confused. Oriented to self and age. Distracted. Tells me he's in [place]. Doesn't know why he's in the hospital. Wet/gurgling voice. Strength symmetrical at 3+ to 4/5. No gross sensory deficits.   Skin: Skin is warm and dry.    Results for orders placed during the hospital encounter of 03/19/14 (from the past 24 hour(s))  BASIC METABOLIC PANEL     Status: Abnormal   Collection Time    03/23/14  2:30 AM      Result Value Ref Range   Sodium 141  137 - 147 mEq/L   Potassium 4.3  3.7 - 5.3 mEq/L   Chloride 100  96 - 112 mEq/L   CO2 28  19 - 32 mEq/L   Glucose, Bld 94  70 - 99 mg/dL   BUN 45 (*) 6 - 23 mg/dL   Creatinine, Ser 1.61  0.50 - 1.35 mg/dL   Calcium 8.7  8.4 - 09.6 mg/dL   GFR calc non Af Amer 61 (*) >90 mL/min   GFR calc Af Amer 70 (*) >90 mL/min   Anion gap 13  5 - 15  CBC WITH DIFFERENTIAL     Status: Abnormal   Collection Time    03/23/14  2:30 AM      Result Value Ref Range   WBC  7.7  4.0 - 10.5 K/uL   RBC 4.54  4.22 - 5.81 MIL/uL   Hemoglobin 15.6  13.0 - 17.0 g/dL   HCT 04.5  40.9 - 81.1 %   MCV 103.7 (*) 78.0 - 100.0 fL   MCH 34.4 (*) 26.0 - 34.0 pg   MCHC 33.1  30.0 - 36.0 g/dL   RDW 91.4  78.2 - 95.6 %   Platelets 156  150 - 400 K/uL   Neutrophils Relative % 50  43 - 77 %   Neutro Abs 3.8  1.7 - 7.7 K/uL   Lymphocytes Relative 35  12 - 46 %   Lymphs Abs 2.7  0.7 - 4.0 K/uL   Monocytes Relative 11  3 - 12 %   Monocytes Absolute 0.8  0.1 - 1.0 K/uL   Eosinophils Relative 4  0 - 5 %   Eosinophils Absolute 0.3  0.0 - 0.7 K/uL   Basophils Relative 0  0 - 1 %   Basophils Absolute 0.0  0.0 - 0.1 K/uL  PRO B  NATRIURETIC PEPTIDE     Status: Abnormal   Collection Time    03/23/14  2:30 AM      Result Value Ref Range   Pro B Natriuretic peptide (BNP) 2646.0 (*) 0 - 450 pg/mL   Dg Chest Port 1 View  03/21/2014   CLINICAL DATA:  Shortness of breath.  CHF.  EXAM: PORTABLE CHEST - 1 VIEW  COMPARISON:  Earlier today.  FINDINGS: Stable enlarged cardiac silhouette. Mildly decreased dense left lower lobe airspace opacity. Decreased prominence of the pulmonary vasculature and interstitial markings. Decreased size of small bilateral pleural effusions. Diffuse osteopenia, bilateral shoulder degenerative changes and thoracic spine degenerative changes. Left shoulder loose bodies.  IMPRESSION: 1. Mildly improved dense left lower lobe atelectasis or pneumonia. 2. Further improvement in changes of congestive heart failure.   Electronically Signed   By: Gordan PaymentSteve  Reid M.D.   On: 03/21/2014 09:47    Assessment/Plan: Diagnosis: deconditioning, dementia 1. Does the need for close, 24 hr/day medical supervision in concert with the patient's rehab needs make it unreasonable for this patient to be served in a less intensive setting? No 2. Co-Morbidities requiring supervision/potential complications: see above 3. Due to bladder management, bowel management, safety, skin/wound care, disease  management, medication administration, pain management and patient education, does the patient require 24 hr/day rehab nursing? No 4. Does the patient require coordinated care of a physician, rehab nurse, PT, OT, SLP to address physical and functional deficits in the context of the above medical diagnosis(es)? Potentially Addressing deficits in the following areas: balance, locomotion, strength, grooming and swallowing 5. Can the patient actively participate in an intensive therapy program of at least 3 hrs of therapy per day at least 5 days per week? Potentially 6. The potential for patient to make measurable gains while on inpatient rehab is fair 7. Anticipated functional outcomes upon discharge from inpatient rehab are n/a  with PT, n/a with OT, n/a with SLP. 8. Estimated rehab length of stay to reach the above functional goals is: n/a 9. Does the patient have adequate social supports to accommodate these discharge functional goals? N/A 10. Anticipated D/C setting: Other 11. Anticipated post D/C treatments: N/A 12. Overall Rehab/Functional Prognosis: fair  RECOMMENDATIONS: This patient's condition is appropriate for continued rehabilitative care in the following setting: SNF Patient has agreed to participate in recommended program. N/A Note that insurance prior authorization may be required for reimbursement for recommended care.  Comment: ?SLP eval for ?dysphagia---voice quite "wet" sounding after just having eaten his lunch.     03/23/2014

## 2014-03-23 NOTE — Progress Notes (Signed)
Patient Name: Sean Carlson Date of Encounter: 03/23/2014     Principal Problem:   Congestive heart failure Active Problems:   Dementia   Hypertension   Bronchospasm   Physical deconditioning   Prerenal azotemia   Abnormality of lung on chest x-ray    SUBJECTIVE  Patient is a poor historian and tend to change his answers. Admit to some chest discomfort, however does not know if related to his frequent cough. Has been having some cough relieved with xopenex, however lots of phlegm. When asked if wheezing recently started, he states it's been going on a long time, however not sure if patient can differentiate meaning of wheezing vs SOB.   CURRENT MEDS . antiseptic oral rinse  15 mL Mouth Rinse BID  . docusate sodium  100 mg Oral BID  . donepezil  10 mg Oral QHS  . dutasteride  0.5 mg Oral Daily  . enoxaparin (LOVENOX) injection  40 mg Subcutaneous Q24H  . furosemide  40 mg Oral BID  . memantine  10 mg Oral BID  . metoprolol tartrate  25 mg Oral QID  . potassium chloride  20 mEq Oral Daily  . tamsulosin  0.4 mg Oral Daily    OBJECTIVE  Filed Vitals:   03/22/14 1415 03/22/14 2154 03/22/14 2155 03/23/14 0559  BP: 125/64 135/68  128/78  Pulse: 73 61  66  Temp: 98.2 F (36.8 C) 97.5 F (36.4 C)  97.6 F (36.4 C)  TempSrc: Oral   Oral  Resp: 20 22  20   Height:      Weight:    169 lb 9.3 oz (76.92 kg)  SpO2: 91% 93% 98% 100%    Intake/Output Summary (Last 24 hours) at 03/23/14 0746 Last data filed at 03/23/14 0500  Gross per 24 hour  Intake    720 ml  Output   1185 ml  Net   -465 ml   Filed Weights   03/21/14 0625 03/22/14 0403 03/23/14 0559  Weight: 169 lb 14.4 oz (77.066 kg) 169 lb 12.1 oz (77 kg) 169 lb 9.3 oz (76.92 kg)    PHYSICAL EXAM  General: Pleasant, NAD. Neuro: Alert, however has dementia, unable to recall things and his answer changes. Moves all extremities spontaneously. Psych: flat with dementia HEENT:  Normal  Neck: Supple without bruits or  JVD. Lungs:  Resp regular and unlabored, diffuse wheezing bilaterally Heart: irregular no s3, s4, or murmurs. Abdomen: Soft, non-tender, non-distended, BS + x 4.  Extremities: No clubbing, cyanosis or edema. DP/PT/Radials 2+ and equal bilaterally.  Accessory Clinical Findings  CBC  Recent Labs  03/23/14 0230  WBC 7.7  NEUTROABS 3.8  HGB 15.6  HCT 47.1  MCV 103.7*  PLT 156   Basic Metabolic Panel  Recent Labs  03/22/14 0315 03/23/14 0230  NA 139 141  K 3.8 4.3  CL 97 100  CO2 28 28  GLUCOSE 141* 94  BUN 43* 45*  CREATININE 1.18 1.07  CALCIUM 8.9 8.7    TELE  A-fib/a-flutter with HR 80-90s, occasional rise in 120s  ECG  Atrial flutter with 2:1 block, incomplete RBBB  Radiology/Studies  Dg Chest 1 View  03/21/2014   CLINICAL DATA:  Shortness of breath.  CHF.  EXAM: CHEST - 1 VIEW  COMPARISON:  03/19/2012.  FINDINGS: Stable enlarged cardiac silhouette. Prominent pulmonary vasculature and interstitial markings with mild improvement. Small bilateral pleural effusions, also improved. Stable dense left lower lobe opacity.  IMPRESSION: 1. Mildly improved changes of congestive heart  failure. 2. Stable cardiomegaly and dense left lower lobe atelectasis or pneumonia.   Electronically Signed   By: Gordan PaymentSteve  Reid M.D.   On: 03/21/2014 07:30   Dg Chest Port 1 View  03/21/2014   CLINICAL DATA:  Shortness of breath.  CHF.  EXAM: PORTABLE CHEST - 1 VIEW  COMPARISON:  Earlier today.  FINDINGS: Stable enlarged cardiac silhouette. Mildly decreased dense left lower lobe airspace opacity. Decreased prominence of the pulmonary vasculature and interstitial markings. Decreased size of small bilateral pleural effusions. Diffuse osteopenia, bilateral shoulder degenerative changes and thoracic spine degenerative changes. Left shoulder loose bodies.  IMPRESSION: 1. Mildly improved dense left lower lobe atelectasis or pneumonia. 2. Further improvement in changes of congestive heart failure.    Electronically Signed   By: Gordan PaymentSteve  Reid M.D.   On: 03/21/2014 09:47   Dg Chest Portable 1 View  03/19/2014   CLINICAL DATA:  Shortness of breath  EXAM: PORTABLE CHEST - 1 VIEW  COMPARISON:  January 10, 2014  FINDINGS: The mediastinal contour is normal. The heart size is enlarged. There is interstitial pulmonary edema. There are small bilateral pleural effusions. Patchy consolidation of both lung bases are noted. These osseous structures are stable.  IMPRESSION: Congestive heart failure. Small bilateral pleural effusions. Consolidation of both lung bases in part due to atelectasis but superimposed pneumonia is not excluded.   Electronically Signed   By: Sherian ReinWei-Chen  Lin M.D.   On: 03/19/2014 14:58    ASSESSMENT AND PLAN  80 yo patient with dementia presented with dyspnea x 1 wk on 7/2. Also noted to be in new onset atrial flutter. He is deemed not a candidate for ablation/dccv or anticoagulation given his dementia. Echo originally ordered, however wasn't done until 7/5. EF 45% with wall motion abnormality.  1. Dyspnea 2/2 CHF and a-flutter 2. Acute combined systolic HF  - Echo EF 40-45%, diffuse hypokinesis with severe hypokinesis of basal midinferolateral and inferior myocardium, mild AR and MR, left pleural effusion  - ?if need further workup as inpt vs outpt given diffuse hypokinesis with regional severity, however likely not a candidate for invasive therapy. If decide on medical therapy, may consider addition of ASA and statin  - mild change in Cr since start diuresis, if Cr continue to be stable on f/u, may consider add lisinopril for CHF  3. A-flutter - rate controlled  - normal TSH  - not a candidate for anticoagulation or DCCV or ablation, seen by Dr. Ladona Ridgelaylor  - would like to continue BB given reduced EF, however if continue to have wheezing, need to r/o bronchospasm 2/2 BB and switch to other rate control medication - will check with MD as patient appears to have a lot of wheezing and phlegm,  however unknown duration due to poor historian   4. HTN 5. Dementia 6. DNR status   Dispo: likely inpt rehab  Signed, Amedeo PlentyMeng, Hao PA-C Pager: [phone]  The patient was seen, examined and discussed with Azalee CourseHao Meng, PA-C and I agree with the above.   A 80 year old male with advanced dementia who was admitted with acute on chronic systolic CHF in the settings of newly diagnosed atrial flutter. The patient is not a candidate for chronic anticoagulation considering his high fall risk.  We will switch metoprolol to nebivolol 2.5 mg po daily because of concern of metoprolol induced bronchospasm.   Sean Carlson, Sean Carlson 03/23/2014

## 2014-03-23 NOTE — Evaluation (Signed)
I have read and agree with this note.   Time in/out:1603-1644 Total time: 41 minutes (E, 2SC)  Ignacia Palmaathy Jonovan Boedecker, OTR/L [phone]

## 2014-03-23 NOTE — Evaluation (Signed)
Occupational Therapy Evaluation and Discharge Patient Details Name: Sean Carlson MRN: [id number] DOB: Jun 26, 1926 Today's Date: 03/23/2014    History of Present Illness Adm 03/19/14 with dyspnea. Acute CHF, atrial flutter. Doppler negative DVT bil LEs PMHx- dementia, HTN, OA   Clinical Impression   PTA pt was living at ALF and is unable to state his independence level with ADLs. Pt presents with generalized weakness and decreased balance from above limiting his independence with self care activities. Pt has been living at ALF for over a year and now needs 24 hour supervision, therefore D/C to SNF or back to ALF if they can increase his care, is appropriate. All additional OT needs will be deferred to SNF, we will sign off.    Follow Up Recommendations  Supervision/Assistance - 24 hour;SNF    Equipment Recommendations   (TBD at next venue)       Precautions / Restrictions Precautions Precautions: Fall      Mobility Bed Mobility Overal bed mobility: Needs Assistance Bed Mobility: Supine to Sit     Supine to sit: HOB elevated;Supervision     General bed mobility comments: HOB elevated, however no assist needed.  Took incr time for pt to acheive EOB.    Transfers Overall transfer level: Needs assistance Equipment used: Rolling walker (2 wheeled) Transfers: Sit to/from Stand Sit to Stand: Min guard         General transfer comment: min guard assist due to safety.  Took incr time for pt to get to standing.     Balance Overall balance assessment: Needs assistance;History of Falls Sitting-balance support: No upper extremity supported;Feet supported Sitting balance-Leahy Scale: Normal     Standing balance support: Single extremity supported;During functional activity Standing balance-Leahy Scale: Poor                 High Level Balance Comments: Needed min assist for static standing balance during grooming tasks.            ADL Overall ADL's : Needs  assistance/impaired Eating/Feeding: Independent;Sitting   Grooming: Standing;Minimal assistance   Upper Body Bathing: Sitting;Set up   Lower Body Bathing: Sit to/from stand;Moderate assistance   Upper Body Dressing : Set up;Sitting   Lower Body Dressing: Sit to/from stand;Moderate assistance   Toilet Transfer: Ambulation;RW;Min guard   Toileting- Clothing Manipulation and Hygiene: Sit to/from stand;Moderate assistance       Functional mobility during ADLs: Rolling walker;Min guard General ADL Comments: Pt is a poor historian and therefore unable to determine PLOF with ADLs but he has been living at ALF for over a year per chart. Pt able to standing for 2 grooming tasks at the sink for 5 minutes with min A with postural sway and heavy UE reliance on counter top. Pt needed VC for to maintain upright posture during standing.               Pertinent Vitals/Pain No c/o pain during tx. Monitored O2 during tx, at while supine O2 was 92%, sitting was 96% and sitting after standing for 5 minutes was 96%.        Extremity/Trunk Assessment Upper Extremity Assessment Upper Extremity Assessment: Overall WFL for tasks assessed   Lower Extremity Assessment Lower Extremity Assessment: Defer to PT evaluation       Communication Communication Communication: HOH   Cognition Arousal/Alertness: Awake/alert Behavior During Therapy: WFL for tasks assessed/performed Overall Cognitive Status: No family/caregiver present to determine baseline cognitive functioning Area of Impairment: Orientation;Memory;Safety/judgement;Awareness;Problem solving Orientation Level: Disoriented to;Place;Time;Situation  Memory: Decreased short-term memory   Safety/Judgement: Decreased awareness of safety;Decreased awareness of deficits Awareness: Emergent Problem Solving: Requires verbal cues                Home Living Family/patient expects to be discharged to:: Assisted living Living Arrangements:  Spouse/significant other                           Home Equipment: Walker - 2 wheels        Prior Functioning/Environment Level of Independence: Needs assistance        Comments: Pt is poor historian and answers PLOF questions with "I don't know"             OT Goals(Current goals can be found in the care plan section) Acute Rehab OT Goals Patient Stated Goal: no goals stated  OT Frequency:      End of Session Equipment Utilized During Treatment: Gait belt;Rolling walker  Activity Tolerance: Patient tolerated treatment well Patient left: in chair;with call bell/phone within reach;with chair alarm set   Time:  -    Charges:    G-CodesMaurene Carlson:    Sean Carlson 03/23/2014, 5:26 PM

## 2014-03-23 NOTE — Progress Notes (Signed)
Rehab Admissions Coordinator Note:  Patient was screened by Trish MageLogue, Saleem Coccia M for appropriateness for an Inpatient Acute Rehab Consult.  At this time, an inpatient rehab consult is pending completion.  We will follow up once consult is completed.  Lelon FrohlichLogue, Ayannah Faddis M 03/23/2014, 8:40 AM  I can be reached at [phone].

## 2014-03-24 DIAGNOSIS — I5041 Acute combined systolic (congestive) and diastolic (congestive) heart failure: Secondary | ICD-10-CM

## 2014-03-24 MED ORDER — POTASSIUM CHLORIDE CRYS ER 20 MEQ PO TBCR: 20.0000 meq | EXTENDED_RELEASE_TABLET | Freq: Every day | ORAL | Status: AC

## 2014-03-24 MED ORDER — LOSARTAN POTASSIUM 25 MG PO TABS
25.0000 mg | ORAL_TABLET | Freq: Every day | ORAL | Status: DC
  Administered 2014-03-24: 25 mg via ORAL
  Filled 2014-03-24: qty 1

## 2014-03-24 MED ORDER — NEBIVOLOL HCL 2.5 MG PO TABS: 2.5000 mg | ORAL_TABLET | Freq: Every day | ORAL | Status: DC

## 2014-03-24 MED ORDER — FUROSEMIDE 40 MG PO TABS: 40.0000 mg | ORAL_TABLET | Freq: Two times a day (BID) | ORAL | Status: AC

## 2014-03-24 NOTE — Progress Notes (Signed)
Patient Name: Sean Carlson Date of Encounter: 03/24/2014     Principal Problem:   Congestive heart failure Active Problems:   Dementia   Hypertension   Bronchospasm   Physical deconditioning   Prerenal azotemia   Abnormality of lung on chest x-ray    SUBJECTIVE  Per nursing staff, patient's coughing is better, did not notice any cough this morning. According to the patient who has dementia, his cough is about same, denies chest pain and SOB, however his answer is often inconsistent due to dementia. More alert this morning.    CURRENT MEDS . antiseptic oral rinse  15 mL Mouth Rinse BID  . docusate sodium  100 mg Oral BID  . donepezil  10 mg Oral QHS  . dutasteride  0.5 mg Oral Daily  . enoxaparin (LOVENOX) injection  40 mg Subcutaneous Q24H  . furosemide  40 mg Oral BID  . memantine  10 mg Oral BID  . nebivolol  2.5 mg Oral Daily  . potassium chloride  20 mEq Oral Daily  . tamsulosin  0.4 mg Oral Daily    OBJECTIVE  Filed Vitals:   03/23/14 1521 03/23/14 2107 03/24/14 0424 03/24/14 0427  BP: 140/84 139/68  147/84  Pulse: 78 59  92  Temp: 97.6 F (36.4 C) 98.4 F (36.9 C)  97.4 F (36.3 C)  TempSrc: Oral Oral  Oral  Resp: 18 18  18   Height:      Weight:   165 lb 8 oz (75.07 kg)   SpO2: 99% 100%  96%    Intake/Output Summary (Last 24 hours) at 03/24/14 0810 Last data filed at 03/24/14 0424  Gross per 24 hour  Intake    720 ml  Output   1670 ml  Net   -950 ml   Filed Weights   03/22/14 0403 03/23/14 0559 03/24/14 0424  Weight: 169 lb 12.1 oz (77 kg) 169 lb 9.3 oz (76.92 kg) 165 lb 8 oz (75.07 kg)    PHYSICAL EXAM  General: Pleasant, NAD. Neuro: Alert and oriented X 3. Moves all extremities spontaneously. Psych: Normal affect. HEENT:  Normal  Neck: Supple without bruits or JVD. Lungs:  Resp regular and unlabored, CTA. Heart: irregular no s3, s4, or murmurs. Abdomen: Soft, non-tender, non-distended, BS + x 4.  Extremities: No clubbing, cyanosis or  edema. DP/PT/Radials 2+ and equal bilaterally.  Accessory Clinical Findings  CBC  Recent Labs  03/23/14 0230  WBC 7.7  NEUTROABS 3.8  HGB 15.6  HCT 47.1  MCV 103.7*  PLT 156   Basic Metabolic Panel  Recent Labs  03/22/14 0315 03/23/14 0230  NA 139 141  K 3.8 4.3  CL 97 100  CO2 28 28  GLUCOSE 141* 94  BUN 43* 45*  CREATININE 1.18 1.07  CALCIUM 8.9 8.7    TELE  A-flutter/a-fib rate controlled with HR 60-90s, occasional 100s  ECG   No recent EKG. Last EKG 7/3 2:1 a-flutter with HR 120s  Radiology/Studies  Dg Chest 1 View  03/21/2014   CLINICAL DATA:  Shortness of breath.  CHF.  EXAM: CHEST - 1 VIEW  COMPARISON:  03/19/2012.  FINDINGS: Stable enlarged cardiac silhouette. Prominent pulmonary vasculature and interstitial markings with mild improvement. Small bilateral pleural effusions, also improved. Stable dense left lower lobe opacity.  IMPRESSION: 1. Mildly improved changes of congestive heart failure. 2. Stable cardiomegaly and dense left lower lobe atelectasis or pneumonia.   Electronically Signed   By: Gordan PaymentSteve  Reid M.D.   On:  03/21/2014 07:30   Dg Chest Port 1 View  03/21/2014   CLINICAL DATA:  Shortness of breath.  CHF.  EXAM: PORTABLE CHEST - 1 VIEW  COMPARISON:  Earlier today.  FINDINGS: Stable enlarged cardiac silhouette. Mildly decreased dense left lower lobe airspace opacity. Decreased prominence of the pulmonary vasculature and interstitial markings. Decreased size of small bilateral pleural effusions. Diffuse osteopenia, bilateral shoulder degenerative changes and thoracic spine degenerative changes. Left shoulder loose bodies.  IMPRESSION: 1. Mildly improved dense left lower lobe atelectasis or pneumonia. 2. Further improvement in changes of congestive heart failure.   Electronically Signed   By: Gordan PaymentSteve  Reid M.D.   On: 03/21/2014 09:47   Dg Chest Portable 1 View  03/19/2014   CLINICAL DATA:  Shortness of breath  EXAM: PORTABLE CHEST - 1 VIEW  COMPARISON:  January 10, 2014  FINDINGS: The mediastinal contour is normal. The heart size is enlarged. There is interstitial pulmonary edema. There are small bilateral pleural effusions. Patchy consolidation of both lung bases are noted. These osseous structures are stable.  IMPRESSION: Congestive heart failure. Small bilateral pleural effusions. Consolidation of both lung bases in part due to atelectasis but superimposed pneumonia is not excluded.   Electronically Signed   By: Sherian ReinWei-Chen  Lin M.D.   On: 03/19/2014 14:58    ASSESSMENT AND PLAN  80 yo patient with dementia presented with dyspnea x 1 wk on 7/2. Also noted to be in new onset atrial flutter. He is deemed not a candidate for ablation/dccv or anticoagulation given his dementia. Echo originally ordered, however wasn't done until 7/5. EF 45% with wall motion abnormality.  1. Dyspnea 2/2 CHF and a-flutter   2. Acute combined systolic HF  - Echo EF 40-45%, diffuse hypokinesis with severe hypokinesis of basal midinferolateral and inferior myocardium, mild AR and MR, left pleural effusion  - ?if need further workup as inpt vs outpt given diffuse hypokinesis with regional severity, however likely not a candidate for invasive therapy. If decide on medical therapy, may consider addition of ASA and statin  - mild change in Cr since start diuresis, if Cr continue to be stable on f/u, may consider add lisinopril for CHF   3. A-flutter - rate controlled  - normal TSH  - not a candidate for anticoagulation or DCCV or ablation, seen by Dr. Ladona Ridgelaylor  - BB changed to nebivolol given concern of bronchospasm, sx improved  4. HTN  5. Dementia  6. DNR status   Disposition: stable from cardiology perspective, need outpatient followup, plan to discharge to SNF instead of inpt rehab once bed available.    Ramond DialSigned, Meng, Hao PA-C Pager: [phone]  I have seen and examined the patient along with Azalee CourseMeng, Hao PA-C.  I have reviewed the chart, notes and new data.  I agree with PA's  note.  Key new complaints: looks comfortable. No cough during exam, lying almost flat Key examination changes: no JVD, remains in a flutter with good rate control Key new findings / data: weight down 8kg, net diuresis 7L since admission, creat normal  PLAN: Add ARB, otherwise, continue current meds. Consider uptitration of beta blocker in 2 weeks as an outpatient.  Thurmon FairMihai Assata Juncaj, MD, [facility] [phone] 03/24/2014, 8:29 AM

## 2014-03-24 NOTE — Discharge Summary (Signed)
Physician Discharge Summary  Patient ID: Sean Carlson MRN: [id number] DOB/AGE: 05/04/1926 80 y.o.  Admit date: 03/19/2014 Discharge date: 03/24/2014  Admission Diagnoses:  Discharge Diagnoses:  Principal Problem:   Congestive heart failure, acute ejection fraction 45% Atrial flutter newly diagnosed, rate control, not a candidate for anticoagulation, no plans for invasive evaluation due to patient's age, dementia. Active Problems:   Dementia   Hypertension   Bronchospasm, resolved after changing beta blocker   Physical deconditioning   Prerenal azotemia, resolved with reduction of diuretic. Discharge creatinine one   Abnormality of lung on chest x-ray, left lower lobe atelectasis, seen on previous outpatient x-ray   Discharged Condition: fair  Hospital Course:   Patient presented to the hospital with complaint of shortness of breath. He was in a assisted living facility. In emergency room patient was evaluated chest x-ray showed CHF, BNP was elevated. Admission was deemed necessary for further evaluation and treatment. Please see dictated H&P for further details. Patient was admitted to a medical floor bed, diurese as was started. 2-D echo was obtained, cardiology consult was obtained. In the interim, patient was kept on a monitor. Patient monitor did show atrial flutter. It was rate controlled. Patient's medications were adjusted. 2-D echo showed reduced ejection fraction. Due to patient's age, dementia no invasive evaluation was planned. Patient appears to be euvolemic on his current dose of diuretics. Patient was evaluated by physical therapy, skilled nursing facility was recommended. I have discussed case with patient's son in detail. He is agreeable to skilled nursing facility.  Consults: Treatment Team:  Pricilla RifflePaula Jun V, MD  Significant Diagnostic Studies:Dg Chest 1 View  03/21/2014   CLINICAL DATA:  Shortness of breath.  CHF.  EXAM: CHEST - 1 VIEW  COMPARISON:  03/19/2012.  FINDINGS:  Stable enlarged cardiac silhouette. Prominent pulmonary vasculature and interstitial markings with mild improvement. Small bilateral pleural effusions, also improved. Stable dense left lower lobe opacity.  IMPRESSION: 1. Mildly improved changes of congestive heart failure. 2. Stable cardiomegaly and dense left lower lobe atelectasis or pneumonia.   Electronically Signed   By: Gordan PaymentSteve  Reid M.D.   On: 03/21/2014 07:30   Dg Chest Port 1 View  03/21/2014   CLINICAL DATA:  Shortness of breath.  CHF.  EXAM: PORTABLE CHEST - 1 VIEW  COMPARISON:  Earlier today.  FINDINGS: Stable enlarged cardiac silhouette. Mildly decreased dense left lower lobe airspace opacity. Decreased prominence of the pulmonary vasculature and interstitial markings. Decreased size of small bilateral pleural effusions. Diffuse osteopenia, bilateral shoulder degenerative changes and thoracic spine degenerative changes. Left shoulder loose bodies.  IMPRESSION: 1. Mildly improved dense left lower lobe atelectasis or pneumonia. 2. Further improvement in changes of congestive heart failure.   Electronically Signed   By: Gordan PaymentSteve  Reid M.D.   On: 03/21/2014 09:47   Dg Chest Portable 1 View  03/19/2014   CLINICAL DATA:  Shortness of breath  EXAM: PORTABLE CHEST - 1 VIEW  COMPARISON:  January 10, 2014  FINDINGS: The mediastinal contour is normal. The heart size is enlarged. There is interstitial pulmonary edema. There are small bilateral pleural effusions. Patchy consolidation of both lung bases are noted. These osseous structures are stable.  IMPRESSION: Congestive heart failure. Small bilateral pleural effusions. Consolidation of both lung bases in part due to atelectasis but superimposed pneumonia is not excluded.   Electronically Signed   By: Sherian ReinWei-Chen  Lin M.D.   On: 03/19/2014 14:58      Discharge Exam: Blood pressure 147/84, pulse 92, temperature 97.4  F (36.3 C), temperature source Oral, resp. rate 18, height 5\' 10"  (1.778 m), weight 75.07 kg (165  lb 8 oz), SpO2 96.00%. General appearance: pleasantly  confused, no apparent distress Resp: clear to auscultation bilaterally Cardio: regular rate and rhythm Extremities: extremities normal, atraumatic, no cyanosis or edema  Disposition: to skilled nursing facility     Medication List         donepezil 10 MG tablet  Commonly known as:  ARICEPT  Take 10 mg by mouth at bedtime.     furosemide 40 MG tablet  Commonly known as:  LASIX  Take 1 tablet (40 mg total) by mouth 2 (two) times daily.     JALYN 0.5-0.4 MG Caps  Generic drug:  Dutasteride-Tamsulosin HCl  Take 1 capsule by mouth daily.     lisinopril 5 MG tablet  Commonly known as:  PRINIVIL,ZESTRIL  Take 5 mg by mouth daily.     loperamide 2 MG capsule  Commonly known as:  IMODIUM  Take 1 capsule (2 mg total) by mouth 4 (four) times daily as needed for diarrhea or loose stools.     meloxicam 15 MG tablet  Commonly known as:  MOBIC  Take 15 mg by mouth daily.     memantine 10 MG tablet  Commonly known as:  NAMENDA  Take 10 mg by mouth 2 (two) times daily.     nebivolol 2.5 MG tablet  Commonly known as:  BYSTOLIC  Take 1 tablet (2.5 mg total) by mouth daily.     potassium chloride SA 20 MEQ tablet  Commonly known as:  K-DUR,KLOR-CON  Take 1 tablet (20 mEq total) by mouth daily.           Follow-up Information   Follow up with Zaylon Bossier D, MD In 1 week.   Specialty:  Internal Medicine   Contact information:   [address] [place] [address] [phone]     35 minutes respectively discharge process of this patient, medication reconciliation, discussing case with social worker, and family.  SignedKaty Apo: Malacai Grantz D 03/24/2014, 12:25 PM

## 2014-03-24 NOTE — Clinical Social Work Placement (Signed)
     Clinical Social Work Department CLINICAL SOCIAL WORK PLACEMENT NOTE 03/24/2014  Patient:  Warden FillersROSS,Percy  Account Number:  [id number] Admit date:  03/19/2014  Clinical Social Worker:  Lupita LeashNNA Makaylah Oddo, LCSWA  Date/time:  03/24/2014 11:50 AM  Clinical Social Work is seeking post-discharge placement for this patient at the following level of care:   SKILLED NURSING   (*CSW will update this form in Epic as items are completed)   03/24/2014  Patient/family provided with [facility] Department of Clinical Social Works list of facilities offering this level of care within the geographic area requested by the patient (or if unable, by the patients family).  03/24/2014  Patient/family informed of their freedom to choose among providers that offer the needed level of care, that participate in Medicare, Medicaid or managed care program needed by the patient, have an available bed and are willing to accept the patient.  03/24/2014  Patient/family informed of [facility] ownership interest in [facility], as well as of the fact that they are under no obligation to receive care at this facility.  PASARR submitted to EDS on 03/23/2014 PASARR number received on 03/23/2014  FL2 transmitted to all facilities in geographic area requested by pt/family on  03/24/2014 FL2 transmitted to all facilities within larger geographic area on   Patient informed that his/her managed care company has contracts with or will negotiate with  certain facilities, including the following:   [employer]- Auth requested     Patient/family informed of bed offers received:  03/24/2014 [facility] Physician recommends and patient chooses bed at    [facility]  03/24/2014 Patient to be transferred to facility by [facility]) Patient and family notified of transfer on 03/24/2014 Name of family member  notified:  Larwance Roteameron Ollis- Son  The following physician request were entered in Epic: Physician Request  Please sign FL2.    Additional Comments: 03/24/14  [facility] unable to accept patient and CSW spoke to Delice Les[facility]Melissa Hairston at [facility] per request of son Sheria LangCameron- she feels that patient would benefit from increased level of care for short term rehab. Patient and son are in agreement and SNF bed offers given and son accepted. Ok per MD for d/c today. Son was very pleased wtih d/c process; patient is agreeable to SNF but is somewhat confused. Nursing called report to SNF and CSW signing off.

## 2014-03-24 NOTE — Progress Notes (Signed)
Pt d/c to [facility] via ambulance.  Amanda PeaNellie Saturnino Liew, Charity fundraiserN.

## 2014-03-24 NOTE — Progress Notes (Signed)
Informed by Comer Locketonna SW that pt does not have the DNR form for transport and pt's son is aware and he is  ok  With it and agree that  pt can be Resusciated if coded [facility].  SW also be  Jacki ConesLaurie charge nurse aware.  Amanda PeaNellie Nessie Nong, Charity fundraiserN.

## 2014-03-24 NOTE — Discharge Instructions (Signed)
Heart Failure  Heart failure is a condition in which the heart has trouble pumping blood. This means your heart does not pump blood efficiently for your body to work well. In some cases of heart failure, fluid may back up into your lungs or you may have swelling (edema) in your lower legs. Heart failure is usually a long-term (chronic) condition. It is important for you to take good care of yourself and follow your caregiver's treatment plan.  CAUSES   Some health conditions can cause heart failure. Those health conditions include:  · High blood pressure (hypertension) causes the heart muscle to work harder than normal. When pressure in the blood vessels is high, the heart needs to pump (contract) with more force in order to circulate blood throughout the body. High blood pressure eventually causes the heart to become stiff and weak.  · Coronary artery disease (CAD) is the buildup of cholesterol and fat (plaque) in the arteries of the heart. The blockage in the arteries deprives the heart muscle of oxygen and blood. This can cause chest pain and may lead to a heart attack. High blood pressure can also contribute to CAD.  · Heart attack (myocardial infarction) occurs when 1 or more arteries in the heart become blocked. The loss of oxygen damages the muscle tissue of the heart. When this happens, part of the heart muscle dies. The injured tissue does not contract as well and weakens the heart's ability to pump blood.  · Abnormal heart valves can cause heart failure when the heart valves do not open and close properly. This makes the heart muscle pump harder to keep the blood flowing.  · Heart muscle disease (cardiomyopathy or myocarditis) is damage to the heart muscle from a variety of causes. These can include drug or alcohol abuse, infections, or unknown reasons. These can increase the risk of heart failure.  · Lung disease makes the heart work harder because the lungs do not work properly. This can cause a strain  on the heart, leading it to fail.  · Diabetes increases the risk of heart failure. High blood sugar contributes to high fat (lipid) levels in the blood. Diabetes can also cause slow damage to tiny blood vessels that carry important nutrients to the heart muscle. When the heart does not get enough oxygen and food, it can cause the heart to become weak and stiff. This leads to a heart that does not contract efficiently.  · Other conditions can contribute to heart failure. These include abnormal heart rhythms, thyroid problems, and low blood counts (anemia).  Certain unhealthy behaviors can increase the risk of heart failure. Those unhealthy behaviors include:  · Being overweight.  · Smoking or chewing tobacco.  · Eating foods high in fat and cholesterol.  · Abusing illicit drugs or alcohol.  · Lacking physical activity.  SYMPTOMS   Heart failure symptoms may vary and can be hard to detect. Symptoms may include:  · Shortness of breath with activity, such as climbing stairs.  · Persistent cough.  · Swelling of the feet, ankles, legs, or abdomen.  · Unexplained weight gain.  · Difficulty breathing when lying flat (orthopnea).  · Waking from sleep because of the need to sit up and get more air.  · Rapid heartbeat.  · Fatigue and loss of energy.  · Feeling lightheaded, dizzy, or close to fainting.  · Loss of appetite.  · Nausea.  · Increased urination during the night (nocturia).  DIAGNOSIS   A diagnosis   of heart failure is based on your history, symptoms, physical examination, and diagnostic tests.  Diagnostic tests for heart failure may include:  · Echocardiography.  · Electrocardiography.  · Chest X-ray.  · Blood tests.  · Exercise stress test.  · Cardiac angiography.  · Radionuclide scans.  TREATMENT   Treatment is aimed at managing the symptoms of heart failure. Medicines, behavioral changes, or surgical intervention may be necessary to treat heart failure.  · Medicines to help treat heart failure may  include:  ¨ Angiotensin-converting enzyme (ACE) inhibitors. This type of medicine blocks the effects of a blood protein called angiotensin-converting enzyme. ACE inhibitors relax (dilate) the blood vessels and help lower blood pressure.  ¨ Angiotensin receptor blockers. This type of medicine blocks the actions of a blood protein called angiotensin. Angiotensin receptor blockers dilate the blood vessels and help lower blood pressure.  ¨ Water pills (diuretics). Diuretics cause the kidneys to remove salt and water from the blood. The extra fluid is removed through urination. This loss of extra fluid lowers the volume of blood the heart pumps.  ¨ Beta blockers. These prevent the heart from beating too fast and improve heart muscle strength.  ¨ Digitalis. This increases the force of the heartbeat.  · Healthy behavior changes include:  ¨ Obtaining and maintaining a healthy weight.  ¨ Stopping smoking or chewing tobacco.  ¨ Eating heart healthy foods.  ¨ Limiting or avoiding alcohol.  ¨ Stopping illicit drug use.  ¨ Physical activity as directed by your caregiver.  · Surgical treatment for heart failure may include:  ¨ A procedure to open blocked arteries, repair damaged heart valves, or remove damaged heart muscle tissue.  ¨ A pacemaker to improve heart muscle function and control certain abnormal heart rhythms.  ¨ An internal cardioverter defibrillator to treat certain serious abnormal heart rhythms.  ¨ A left ventricular assist device to assist the pumping ability of the heart.  HOME CARE INSTRUCTIONS   · Take your medicine as directed by your caregiver. Medicines are important in reducing the workload of your heart, slowing the progression of heart failure, and improving your symptoms.  ¨ Do not stop taking your medicine unless directed by your caregiver.  ¨ Do not skip any dose of medicine.  ¨ Refill your prescriptions before you run out of medicine. Your medicines are needed every day.  ¨ Take over-the-counter  medicine only as directed by your caregiver or pharmacist.  · Engage in moderate physical activity if directed by your caregiver. Moderate physical activity can benefit some people. The elderly and people with severe heart failure should consult with a caregiver for physical activity recommendations.  · Eat heart healthy foods. Food choices should be free of trans fat and low in saturated fat, cholesterol, and salt (sodium). Healthy choices include fresh or frozen fruits and vegetables, fish, lean meats, legumes, fat-free or low-fat dairy products, and whole grain or high fiber foods. Talk to a dietitian to learn more about heart healthy foods.  · Limit sodium if directed by your caregiver. Sodium restriction may reduce symptoms of heart failure in some people. Talk to a dietitian to learn more about heart healthy seasonings.  · Use healthy cooking methods. Healthy cooking methods include roasting, grilling, broiling, baking, poaching, steaming, or stir-frying. Talk to a dietitian to learn more about healthy cooking methods.  · Limit fluids if directed by your caregiver. Fluid restriction may reduce symptoms of heart failure in some people.  · Weigh yourself every day.   Daily weights are important in the early recognition of excess fluid. You should weigh yourself every morning after you urinate and before you eat breakfast. Wear the same amount of clothing each time you weigh yourself. Record your daily weight. Provide your caregiver with your weight record.  · Monitor and record your blood pressure if directed by your caregiver.  · Check your pulse if directed by your caregiver.  · Lose weight if directed by your caregiver. Weight loss may reduce symptoms of heart failure in some people.  · Stop smoking or chewing tobacco. Nicotine makes your heart work harder by causing your blood vessels to constrict. Do not use nicotine gum or patches before talking to your caregiver.  · Schedule and attend follow-up visits as  directed by your caregiver. It is important to keep all your appointments.  · Limit alcohol intake to no more than 1 drink per day for nonpregnant women and 2 drinks per day for men. Drinking more than that is harmful to your heart. Tell your caregiver if you drink alcohol several times a week. Talk with your caregiver about whether alcohol is safe for you. If your heart has already been damaged by alcohol or you have severe heart failure, drinking alcohol should be stopped completely.  · Stop illicit drug use.  · Stay up-to-date with immunizations. It is especially important to prevent respiratory infections through current pneumococcal and influenza immunizations.  · Manage other health conditions such as hypertension, diabetes, thyroid disease, or abnormal heart rhythms as directed by your caregiver.  · Learn to manage stress.  · Plan rest periods when fatigued.  · Learn strategies to manage high temperatures. If the weather is extremely hot:  ¨ Avoid vigorous physical activity.  ¨ Use air conditioning or fans or seek a cooler location.  ¨ Avoid caffeine and alcohol.  ¨ Wear loose-fitting, lightweight, and light-colored clothing.  · Learn strategies to manage cold temperatures. If the weather is extremely cold:  ¨ Avoid vigorous physical activity.  ¨ Layer clothes.  ¨ Wear mittens or gloves, a hat, and a scarf when going outside.  ¨ Avoid alcohol.  · Obtain ongoing education and support as needed.  · Participate or seek rehabilitation as needed to maintain or improve independence and quality of life.  SEEK MEDICAL CARE IF:   · Your weight increases by 03 lb/1.4 kg in 1 day or 05 lb/2.3 kg in a week.  · You have increasing shortness of breath that is unusual for you.  · You are unable to participate in your usual physical activities.  · You tire easily.  · You cough more than normal, especially with physical activity.  · You have any or more swelling in areas such as your hands, feet, ankles, or abdomen.  · You  are unable to sleep because it is hard to breathe.  · You feel like your heart is beating fast (palpitations).  · You become dizzy or lightheaded upon standing up.  SEEK IMMEDIATE MEDICAL CARE IF:   · You have difficulty breathing.  · There is a change in mental status such as decreased alertness or difficulty with concentration.  · You have a pain or discomfort in your chest.  · You have an episode of fainting (syncope).  MAKE SURE YOU:   · Understand these instructions.  · Will watch your condition.  · Will get help right away if you are not doing well or get worse.  Document Released: 09/04/2005 Document Revised: 12/30/2012   Document Reviewed: 09/26/2012  ExitCare® Patient Information ©2015 [employer]. This information is not intended to replace advice given to you by your health care provider. Make sure you discuss any questions you have with your health care provider.

## 2014-03-24 NOTE — Progress Notes (Signed)
[facility].  Verbalized understanding.   Also informed her that pt has not left yet unsure of arrival time as SW still waiting for papers to be completed.  Amanda PeaNellie Kiante Ciavarella, Charity fundraiserN.

## 2014-06-16 ENCOUNTER — Emergency Department (HOSPITAL_COMMUNITY)
Admission: EM | Admit: 2014-06-16 | Discharge: 2014-06-16 | Disposition: A | Discharge status: Skilled Nursing Facility | Payer: Medicare Other | Attending: Emergency Medicine | Admitting: Emergency Medicine

## 2014-06-16 ENCOUNTER — Encounter (HOSPITAL_COMMUNITY): Payer: Self-pay | Admitting: Emergency Medicine

## 2014-06-16 ENCOUNTER — Emergency Department (HOSPITAL_COMMUNITY): Payer: Medicare Other

## 2014-06-16 DIAGNOSIS — IMO0002 Reserved for concepts with insufficient information to code with codable children: Secondary | ICD-10-CM | POA: Insufficient documentation

## 2014-06-16 DIAGNOSIS — S99919A Unspecified injury of unspecified ankle, initial encounter: Secondary | ICD-10-CM

## 2014-06-16 DIAGNOSIS — Z79899 Other long term (current) drug therapy: Secondary | ICD-10-CM | POA: Insufficient documentation

## 2014-06-16 DIAGNOSIS — M1990 Unspecified osteoarthritis, unspecified site: Secondary | ICD-10-CM | POA: Diagnosis not present

## 2014-06-16 DIAGNOSIS — Y939 Activity, unspecified: Secondary | ICD-10-CM | POA: Diagnosis not present

## 2014-06-16 DIAGNOSIS — S32009A Unspecified fracture of unspecified lumbar vertebra, initial encounter for closed fracture: Secondary | ICD-10-CM | POA: Diagnosis not present

## 2014-06-16 DIAGNOSIS — I10 Essential (primary) hypertension: Secondary | ICD-10-CM | POA: Insufficient documentation

## 2014-06-16 DIAGNOSIS — S32000A Wedge compression fracture of unspecified lumbar vertebra, initial encounter for closed fracture: Secondary | ICD-10-CM

## 2014-06-16 DIAGNOSIS — Z791 Long term (current) use of non-steroidal anti-inflammatories (NSAID): Secondary | ICD-10-CM | POA: Insufficient documentation

## 2014-06-16 DIAGNOSIS — F0390 Unspecified dementia without behavioral disturbance: Secondary | ICD-10-CM | POA: Diagnosis not present

## 2014-06-16 DIAGNOSIS — S99929A Unspecified injury of unspecified foot, initial encounter: Secondary | ICD-10-CM

## 2014-06-16 DIAGNOSIS — Y929 Unspecified place or not applicable: Secondary | ICD-10-CM | POA: Diagnosis not present

## 2014-06-16 DIAGNOSIS — R296 Repeated falls: Secondary | ICD-10-CM | POA: Insufficient documentation

## 2014-06-16 DIAGNOSIS — S8990XA Unspecified injury of unspecified lower leg, initial encounter: Secondary | ICD-10-CM | POA: Insufficient documentation

## 2014-06-16 DIAGNOSIS — W19XXXA Unspecified fall, initial encounter: Secondary | ICD-10-CM

## 2014-06-16 LAB — CBC WITH DIFFERENTIAL/PLATELET
BASOS PCT: 0 % (ref 0–1)
Basophils Absolute: 0 10*3/uL (ref 0.0–0.1)
EOS ABS: 0.4 10*3/uL (ref 0.0–0.7)
Eosinophils Relative: 3 % (ref 0–5)
HEMATOCRIT: 43.8 % (ref 39.0–52.0)
HEMOGLOBIN: 14.8 g/dL (ref 13.0–17.0)
Lymphocytes Relative: 26 % (ref 12–46)
Lymphs Abs: 3.3 10*3/uL (ref 0.7–4.0)
MCH: 33.9 pg (ref 26.0–34.0)
MCHC: 33.8 g/dL (ref 30.0–36.0)
MCV: 100.5 fL — ABNORMAL HIGH (ref 78.0–100.0)
MONOS PCT: 10 % (ref 3–12)
Monocytes Absolute: 1.3 10*3/uL — ABNORMAL HIGH (ref 0.1–1.0)
NEUTROS ABS: 7.7 10*3/uL (ref 1.7–7.7)
Neutrophils Relative %: 61 % (ref 43–77)
Platelets: 180 10*3/uL (ref 150–400)
RBC: 4.36 MIL/uL (ref 4.22–5.81)
RDW: 13.7 % (ref 11.5–15.5)
WBC: 12.8 10*3/uL — ABNORMAL HIGH (ref 4.0–10.5)

## 2014-06-16 LAB — BASIC METABOLIC PANEL
Anion gap: 12 (ref 5–15)
BUN: 48 mg/dL — AB (ref 6–23)
CHLORIDE: 104 meq/L (ref 96–112)
CO2: 25 meq/L (ref 19–32)
CREATININE: 1.22 mg/dL (ref 0.50–1.35)
Calcium: 8.9 mg/dL (ref 8.4–10.5)
GFR calc Af Amer: 60 mL/min — ABNORMAL LOW (ref >90)
GFR calc non Af Amer: 52 mL/min — ABNORMAL LOW (ref >90)
Glucose, Bld: 140 mg/dL — ABNORMAL HIGH (ref 70–99)
Potassium: 4.8 mEq/L (ref 3.7–5.3)
Sodium: 141 mEq/L (ref 137–147)

## 2014-06-16 LAB — CK: Total CK: 198 U/L (ref 7–232)

## 2014-06-16 MED ORDER — MELOXICAM 15 MG PO TABS: 15.0000 mg | ORAL_TABLET | Freq: Every day | ORAL | Status: DC

## 2014-06-16 NOTE — ED Provider Notes (Signed)
CSN: [id number]     Arrival date & time 06/16/14  1012 History   First MD Initiated Contact with Patient 06/16/14 1013     Chief Complaint  Patient presents with  . Fall     HPI  The patient presents from his nursing facility after a possible fall. Per report the patient was found on the ground approximately 6 hours ago. The patient has dementia, level V caveat. The patient complains of pain in the lower back, right knee, denies other complaints, dyspnea.   Past Medical History  Diagnosis Date  . Hypertension   . Dementia   . Osteoarthritis    Past Surgical History  Procedure Laterality Date  . Appendectomy     No family history on file. History  Substance Use Topics  . Smoking status: Never Smoker   . Smokeless tobacco: Not on file  . Alcohol Use: Yes     Comment: occasionally    Review of Systems  Unable to perform ROS: Dementia      Allergies  Review of patient's allergies indicates no known allergies.  Home Medications   Prior to Admission medications   Medication Sig Start Date End Date Taking? Authorizing Provider  donepezil (ARICEPT) 10 MG tablet Take 10 mg by mouth at bedtime.    Historical Provider, MD  Dutasteride-Tamsulosin HCl (JALYN) 0.5-0.4 MG CAPS Take 1 capsule by mouth daily.    Historical Provider, MD  furosemide (LASIX) 40 MG tablet Take 1 tablet (40 mg total) by mouth 2 (two) times daily. 03/24/14   Katy Apoonald D Polite, MD  lisinopril (PRINIVIL,ZESTRIL) 5 MG tablet Take 5 mg by mouth daily.    Historical Provider, MD  loperamide (IMODIUM) 2 MG capsule Take 1 capsule (2 mg total) by mouth 4 (four) times daily as needed for diarrhea or loose stools. 01/10/14   Gerhard Munchobert Cherrie Franca, MD  meloxicam (MOBIC) 15 MG tablet Take 15 mg by mouth daily.    Historical Provider, MD  memantine (NAMENDA) 10 MG tablet Take 10 mg by mouth 2 (two) times daily.    Historical Provider, MD  nebivolol (BYSTOLIC) 2.5 MG tablet Take 1 tablet (2.5 mg total) by mouth daily. 03/24/14    Katy Apoonald D Polite, MD  potassium chloride SA (K-DUR,KLOR-CON) 20 MEQ tablet Take 1 tablet (20 mEq total) by mouth daily. 03/24/14   Katy Apoonald D Polite, MD   SpO2 100% Physical Exam  Nursing note and vitals reviewed. Constitutional: He appears well-developed. No distress.  HENT:  Head: Normocephalic and atraumatic.  Eyes: Conjunctivae and EOM are normal.  Cardiovascular: Normal rate and regular rhythm.   Pulmonary/Chest: Effort normal. No stridor. No respiratory distress.  Abdominal: He exhibits no distension.  Musculoskeletal: He exhibits no edema.       Back:       Legs: Neurological: He is alert. He displays atrophy. He displays no tremor. No cranial nerve deficit or sensory deficit. He exhibits normal muscle tone. He displays no seizure activity.  Skin: Skin is warm and dry.  Psychiatric: He has a normal mood and affect. He is slowed. Cognition and memory are impaired.    ED Course  Procedures (including critical care time) Labs Review Labs Reviewed  BASIC METABOLIC PANEL - Abnormal; Notable for the following:    Glucose, Bld 140 (*)    BUN 48 (*)    GFR calc non Af Amer 52 (*)    GFR calc Af Amer 60 (*)    All other components within normal limits  CBC  WITH DIFFERENTIAL - Abnormal; Notable for the following:    WBC 12.8 (*)    MCV 100.5 (*)    Monocytes Absolute 1.3 (*)    All other components within normal limits  CK    Imaging Review Dg Lumbar Spine Complete  06/16/2014   CLINICAL DATA:  80 year old male post fall. Pain. Initial encounter.  EXAM: LUMBAR SPINE - COMPLETE 4+ VIEW  COMPARISON:  None.  FINDINGS: L4 vertebral body compression fracture with mild retropulsion posterior superior aspect. 40% loss of height centrally.  L1 superior endplate Schmorl's node deformity/compression fracture of questionable age.  Minimal Schmorl's node deformity L1-2 and L2-3 level.  Prominent degenerative changes L5-S1.  Vascular calcifications.  IMPRESSION: L4 vertebral body compression  fracture with mild retropulsion posterior superior aspect. 40% loss of height centrally.  L1 superior endplate Schmorl's node deformity/compression fracture of questionable age   Electronically Signed   By: Bridgett Larsson M.D.   On: 06/16/2014 11:47   Dg Knee Complete 4 Views Right  06/16/2014   CLINICAL DATA:  Fall, pain  EXAM: RIGHT KNEE - COMPLETE 4+ VIEW  COMPARISON:  None.  FINDINGS: Previous right knee arthroplasty changes. Bones are osteopenic. No definite malalignment, acute osseous finding, displaced fracture, or effusion. Peripheral atherosclerosis noted.  IMPRESSION: Previous right knee arthroplasty.  Osteopenia  Peripheral atherosclerosis  No acute finding by plain radiography   Electronically Signed   By: Ruel Favors M.D.   On: 06/16/2014 11:47    On re-exam the patient is in no distress, sleeping    MDM  Patient presents with his nursing facility after a possible fall. Patient has back pain, no other complaints, is moving all extremities spontaneously, is hemodynamically stable, and is asleep on repeat exam. Patient's evaluation demonstrates lumbar compression fracture. No evidence of distress, no evidence for neurologic compromise, and only moderate discomfort, patient was appropriate for discharge with further evaluation and management with his primary care physician and orthopedics.   Gerhard Munch, MD 06/16/14 [phone]

## 2014-06-16 NOTE — Discharge Instructions (Signed)
As discussed, your evaluation has resulted in a diagnosis of lumbar compression fracture.  Please take all medication as directed, he should follow up with primary care physician and orthopedic physician.  Return here for concerning changes in your condition   Back, Compression Fracture A compression fracture happens when a force is put upon the length of your spine. Slipping and falling on your bottom are examples of such a force. When this happens, sometimes the force is great enough to compress the building blocks (vertebral bodies) of your spine. Although this causes a lot of pain, this can usually be treated at home, unless your caregiver feels hospitalization is needed for pain control. Your backbone (spinal column) is made up of 24 main vertebral bodies in addition to the sacrum and coccyx (see illustration). These are held together by tough fibrous tissues (ligaments) and by support of your muscles. Nerve roots pass through the openings between the vertebrae. A sudden wrenching move, injury, or a fall may cause a compression fracture of one of the vertebral bodies. This may result in back pain or spread of pain into the belly (abdomen), the buttocks, and down the leg into the foot. Pain may also be created by muscle spasm alone. Large studies have been undertaken to determine the best possible course of action to help your back following injury and also to prevent future problems. The recommendations are as follows. FOLLOWING A COMPRESSION FRACTURE: Do the following only if advised by your caregiver.   If a back brace has been suggested or provided, wear it as directed.  Do not stop wearing the back brace unless instructed by your caregiver.  When allowed to return to regular activities, avoid a sedentary lifestyle. Actively exercise. Sporadic weekend binges of tennis, racquetball, or waterskiing may actually aggravate or create problems, especially if you are not in condition for that  activity.  Avoid sports requiring sudden body movements until you are in condition for them. Swimming and walking are safer activities.  Maintain good posture.  Avoid obesity.  If not already done, you should have a DEXA scan. Based on the results, be treated for osteoporosis. FOLLOWING ACUTE (SUDDEN) INJURY:  Only take over-the-counter or prescription medicines for pain, discomfort, or fever as directed by your caregiver.  Use bed rest for only the most extreme acute episode. Prolonged bed rest may aggravate your condition. Ice used for acute conditions is effective. Use a large plastic bag filled with ice. Wrap it in a towel. This also provides excellent pain relief. This may be continuous. Or use it for 30 minutes every 2 hours during acute phase, then as needed. Heat for 30 minutes prior to activities is helpful.  As soon as the acute phase (the time when your back is too painful for you to do normal activities) is over, it is important to resume normal activities and work [employer]boriculturisthardening programs. Back injuries can cause potentially marked changes in lifestyle. So it is important to attack these problems aggressively.  See your caregiver for continued problems. He or she can help or refer you for appropriate exercises, physical therapy, and work hardening if needed.  If you are given narcotic medications for your condition, for the next 24 hours do not:  Drive.  Operate machinery or power tools.  Sign legal documents.  Do not drink alcohol, or take sleeping pills or other medications that may interfere with treatment. If your caregiver has given you a follow-up appointment, it is very important to keep that appointment.  Not keeping the appointment could result in a chronic or permanent injury, pain, and disability. If there is any problem keeping the appointment, you must call back to this facility for assistance.  SEEK IMMEDIATE MEDICAL CARE IF:  You develop numbness, tingling,  weakness, or problems with the use of your arms or legs.  You develop severe back pain not relieved with medications.  You have changes in bowel or bladder control.  You have increasing pain in any areas of the body. Document Released: 09/04/2005 Document Revised: 01/19/2014 Document Reviewed: 04/08/2008 [facility] Patient Information 2015 [place], [place]. This information is not intended to replace advice given to you by your health care provider. Make sure you discuss any questions you have with your health care provider.

## 2014-06-16 NOTE — ED Notes (Signed)
Bed: WA25 Expected date:  Expected time:  Means of arrival:  Comments: EMS fall 

## 2014-06-16 NOTE — ED Notes (Signed)
Per EMS pt coming from [facility] with c/o fall. Per EMS staff reports pt was found on the floor this morning at 4:00, they helped him back to bed and called EMS after breakfast, around 10 am. Pt denies pain

## 2014-06-17 ENCOUNTER — Emergency Department (HOSPITAL_COMMUNITY)
Admission: EM | Admit: 2014-06-17 | Discharge: 2014-06-17 | Disposition: A | Discharge status: Home / Self Care | Payer: Medicare Other | Attending: Emergency Medicine | Admitting: Emergency Medicine

## 2014-06-17 ENCOUNTER — Encounter (HOSPITAL_COMMUNITY): Payer: Self-pay | Admitting: Emergency Medicine

## 2014-06-17 DIAGNOSIS — I10 Essential (primary) hypertension: Secondary | ICD-10-CM | POA: Diagnosis not present

## 2014-06-17 DIAGNOSIS — Y9210 Unspecified residential institution as the place of occurrence of the external cause: Secondary | ICD-10-CM | POA: Diagnosis not present

## 2014-06-17 DIAGNOSIS — M1990 Unspecified osteoarthritis, unspecified site: Secondary | ICD-10-CM | POA: Diagnosis not present

## 2014-06-17 DIAGNOSIS — Z7982 Long term (current) use of aspirin: Secondary | ICD-10-CM | POA: Diagnosis not present

## 2014-06-17 DIAGNOSIS — F0390 Unspecified dementia without behavioral disturbance: Secondary | ICD-10-CM | POA: Diagnosis not present

## 2014-06-17 DIAGNOSIS — W19XXXA Unspecified fall, initial encounter: Secondary | ICD-10-CM

## 2014-06-17 DIAGNOSIS — Z791 Long term (current) use of non-steroidal anti-inflammatories (NSAID): Secondary | ICD-10-CM | POA: Diagnosis not present

## 2014-06-17 DIAGNOSIS — Z79899 Other long term (current) drug therapy: Secondary | ICD-10-CM | POA: Diagnosis not present

## 2014-06-17 DIAGNOSIS — Y939 Activity, unspecified: Secondary | ICD-10-CM | POA: Diagnosis not present

## 2014-06-17 DIAGNOSIS — IMO0002 Reserved for concepts with insufficient information to code with codable children: Secondary | ICD-10-CM | POA: Diagnosis not present

## 2014-06-17 DIAGNOSIS — R296 Repeated falls: Secondary | ICD-10-CM | POA: Insufficient documentation

## 2014-06-17 LAB — COMPREHENSIVE METABOLIC PANEL
ALT: 31 U/L (ref 0–53)
ANION GAP: 12 (ref 5–15)
AST: 38 U/L — ABNORMAL HIGH (ref 0–37)
Albumin: 3.5 g/dL (ref 3.5–5.2)
Alkaline Phosphatase: 119 U/L — ABNORMAL HIGH (ref 39–117)
BUN: 42 mg/dL — AB (ref 6–23)
CALCIUM: 9 mg/dL (ref 8.4–10.5)
CO2: 25 mEq/L (ref 19–32)
CREATININE: 0.97 mg/dL (ref 0.50–1.35)
Chloride: 102 mEq/L (ref 96–112)
GFR calc non Af Amer: 73 mL/min — ABNORMAL LOW (ref >90)
GFR, EST AFRICAN AMERICAN: 84 mL/min — AB (ref >90)
GLUCOSE: 105 mg/dL — AB (ref 70–99)
Potassium: 4.6 mEq/L (ref 3.7–5.3)
SODIUM: 139 meq/L (ref 137–147)
Total Bilirubin: 1.8 mg/dL — ABNORMAL HIGH (ref 0.3–1.2)
Total Protein: 6.8 g/dL (ref 6.0–8.3)

## 2014-06-17 LAB — CBC WITH DIFFERENTIAL/PLATELET
Basophils Absolute: 0 10*3/uL (ref 0.0–0.1)
Basophils Relative: 0 % (ref 0–1)
EOS ABS: 0.3 10*3/uL (ref 0.0–0.7)
Eosinophils Relative: 2 % (ref 0–5)
HEMATOCRIT: 45.3 % (ref 39.0–52.0)
Hemoglobin: 15.9 g/dL (ref 13.0–17.0)
LYMPHS ABS: 3.2 10*3/uL (ref 0.7–4.0)
Lymphocytes Relative: 27 % (ref 12–46)
MCH: 35.4 pg — AB (ref 26.0–34.0)
MCHC: 35.1 g/dL (ref 30.0–36.0)
MCV: 100.9 fL — ABNORMAL HIGH (ref 78.0–100.0)
MONO ABS: 1.2 10*3/uL — AB (ref 0.1–1.0)
Monocytes Relative: 10 % (ref 3–12)
Neutro Abs: 7.3 10*3/uL (ref 1.7–7.7)
Neutrophils Relative %: 61 % (ref 43–77)
PLATELETS: 175 10*3/uL (ref 150–400)
RBC: 4.49 MIL/uL (ref 4.22–5.81)
RDW: 13.6 % (ref 11.5–15.5)
WBC: 12 10*3/uL — ABNORMAL HIGH (ref 4.0–10.5)

## 2014-06-17 LAB — URINALYSIS, ROUTINE W REFLEX MICROSCOPIC
Bilirubin Urine: NEGATIVE
Glucose, UA: NEGATIVE mg/dL
Hgb urine dipstick: NEGATIVE
Ketones, ur: NEGATIVE mg/dL
Leukocytes, UA: NEGATIVE
NITRITE: NEGATIVE
PROTEIN: NEGATIVE mg/dL
SPECIFIC GRAVITY, URINE: 1.02 (ref 1.005–1.030)
UROBILINOGEN UA: 1 mg/dL (ref 0.0–1.0)
pH: 7.5 (ref 5.0–8.0)

## 2014-06-17 NOTE — Discharge Instructions (Signed)
Fall Prevention and Home Safety °Falls cause injuries and can affect all age groups. It is possible to prevent falls.  °HOW TO PREVENT FALLS °· Wear shoes with rubber soles that do not have an opening for your toes. °· Keep the inside and outside of your house well lit. °· Use night lights throughout your home. °· Remove clutter from floors. °· Clean up floor spills. °· Remove throw rugs or fasten them to the floor with carpet tape. °· Do not place electrical cords across pathways. °· Put grab bars by your tub, shower, and toilet. Do not use towel bars as grab bars. °· Put handrails on both sides of the stairway. Fix loose handrails. °· Do not climb on stools or stepladders, if possible. °· Do not wax your floors. °· Repair uneven or unsafe sidewalks, walkways, or stairs. °· Keep items you use a lot within reach. °· Be aware of pets. °· Keep emergency numbers next to the telephone. °· Put smoke detectors in your home and near bedrooms. °Ask your doctor what other things you can do to prevent falls. °Document Released: 07/01/2009 Document Revised: 03/05/2012 Document Reviewed: 12/05/2011 °[employer]® Patient Information ©2015 [employer]. This information is not intended to replace advice given to you by your health care provider. Make sure you discuss any questions you have with your health care provider. ° °

## 2014-06-17 NOTE — ED Provider Notes (Signed)
CSN: [id number]     Arrival date & time 06/17/14  0159 History   First MD Initiated Contact with Patient 06/17/14 [phone]     Chief Complaint  Patient presents with  . Fall     (Consider location/radiation/quality/duration/timing/severity/associated sxs/prior Treatment) HPI Patient is a nursing home patient it was just seen and evaluated 3 days ago. Was found by assisted-living staff on the floor. Patient has dementia and does not recall events. Level V caveat applies. Is complaining of mild low back pain. He has no focal weakness or numbness. Denies striking head. Denies headache or neck pain. Past Medical History  Diagnosis Date  . Hypertension   . Dementia   . Osteoarthritis    Past Surgical History  Procedure Laterality Date  . Appendectomy     No family history on file. History  Substance Use Topics  . Smoking status: Never Smoker   . Smokeless tobacco: Not on file  . Alcohol Use: Yes     Comment: occasionally    Review of Systems  Constitutional: Negative for fever and chills.  Respiratory: Negative for shortness of breath.   Cardiovascular: Negative for chest pain.  Gastrointestinal: Negative for nausea, vomiting and abdominal pain.  Musculoskeletal: Positive for back pain. Negative for neck pain.  Skin: Negative for rash and wound.  Neurological: Negative for weakness, numbness and headaches.  All other systems reviewed and are negative.     Allergies  Review of patient's allergies indicates no known allergies.  Home Medications   Prior to Admission medications   Medication Sig Start Date End Date Taking? Authorizing Provider  albuterol (PROVENTIL) (2.5 MG/3ML) 0.083% nebulizer solution Take 2.5 mg by nebulization 2 (two) times daily.   Yes Historical Provider, MD  aspirin 81 MG chewable tablet Chew 81 mg by mouth daily.   Yes Historical Provider, MD  donepezil (ARICEPT) 10 MG tablet Take 10 mg by mouth at bedtime.   Yes Historical Provider, MD   Dutasteride-Tamsulosin HCl (JALYN) 0.5-0.4 MG CAPS Take 1 capsule by mouth at bedtime.    Yes Historical Provider, MD  furosemide (LASIX) 40 MG tablet Take 1 tablet (40 mg total) by mouth 2 (two) times daily. 03/24/14  Yes Katy Apoonald D Polite, MD  lisinopril (PRINIVIL,ZESTRIL) 5 MG tablet Take 5 mg by mouth daily.   Yes Historical Provider, MD  loperamide (IMODIUM) 2 MG capsule Take 1 capsule (2 mg total) by mouth 4 (four) times daily as needed for diarrhea or loose stools. 01/10/14  Yes Gerhard Munchobert Lockwood, MD  meloxicam (MOBIC) 15 MG tablet Take 1 tablet (15 mg total) by mouth daily. 06/16/14  Yes Gerhard Munchobert Lockwood, MD  memantine (NAMENDA) 10 MG tablet Take 10 mg by mouth 2 (two) times daily.   Yes Historical Provider, MD  nebivolol (BYSTOLIC) 2.5 MG tablet Take 1 tablet (2.5 mg total) by mouth daily. 03/24/14  Yes Katy Apoonald D Polite, MD  potassium chloride SA (K-DUR,KLOR-CON) 20 MEQ tablet Take 1 tablet (20 mEq total) by mouth daily. 03/24/14  Yes Katy Apoonald D Polite, MD  traMADol (ULTRAM) 50 MG tablet Take 50 mg by mouth every 8 (eight) hours as needed.   Yes Historical Provider, MD   BP 147/77  Pulse 94  Temp(Src) 98.1 F (36.7 C)  Resp 18  Ht 5\' 10"  (1.778 m)  Wt 160 lb (72.576 kg)  BMI 22.96 kg/m2  SpO2 100% Physical Exam  Nursing note and vitals reviewed. Constitutional: He appears well-developed and well-nourished. No distress.  HENT:  Head: Normocephalic and atraumatic.  Mouth/Throat: Oropharynx is clear and moist.  No evidence of head injury.  Eyes: EOM are normal. Pupils are equal, round, and reactive to light.  Neck: Normal range of motion. Neck supple.  No posterior midline cervical tenderness to palpation.  Cardiovascular: Normal rate and regular rhythm.  Exam reveals no gallop and no friction rub.   No murmur heard. Pulmonary/Chest: Effort normal and breath sounds normal. No respiratory distress. He has no wheezes. He has no rales. He exhibits no tenderness.  Abdominal: Soft. Bowel sounds  are normal. He exhibits no distension.  Musculoskeletal: Normal range of motion. He exhibits no edema and no tenderness.  No thoracic or lumbar tenderness to palpation.  Neurological: He is alert.  Patient is alert and oriented to place and self. He is not aware of the year. He follows commands. Moves all extremities without deficit. 5/5 motor. Sensation is intact.  Skin: Skin is warm and dry. No rash noted. No erythema.  Psychiatric: He has a normal mood and affect. His behavior is normal.    ED Course  Procedures (including critical care time) Labs Review Labs Reviewed  CBC WITH DIFFERENTIAL  COMPREHENSIVE METABOLIC PANEL  URINALYSIS, ROUTINE W REFLEX MICROSCOPIC    Imaging Review Dg Lumbar Spine Complete  06/16/2014   CLINICAL DATA:  80 year old male post fall. Pain. Initial encounter.  EXAM: LUMBAR SPINE - COMPLETE 4+ VIEW  COMPARISON:  None.  FINDINGS: L4 vertebral body compression fracture with mild retropulsion posterior superior aspect. 40% loss of height centrally.  L1 superior endplate Schmorl's node deformity/compression fracture of questionable age.  Minimal Schmorl's node deformity L1-2 and L2-3 level.  Prominent degenerative changes L5-S1.  Vascular calcifications.  IMPRESSION: L4 vertebral body compression fracture with mild retropulsion posterior superior aspect. 40% loss of height centrally.  L1 superior endplate Schmorl's node deformity/compression fracture of questionable age   Electronically Signed   By: Bridgett Larsson M.D.   On: 06/16/2014 11:47   Dg Knee Complete 4 Views Right  06/16/2014   CLINICAL DATA:  Fall, pain  EXAM: RIGHT KNEE - COMPLETE 4+ VIEW  COMPARISON:  None.  FINDINGS: Previous right knee arthroplasty changes. Bones are osteopenic. No definite malalignment, acute osseous finding, displaced fracture, or effusion. Peripheral atherosclerosis noted.  IMPRESSION: Previous right knee arthroplasty.  Osteopenia  Peripheral atherosclerosis  No acute finding by plain  radiography   Electronically Signed   By: Ruel Favors M.D.   On: 06/16/2014 11:47     EKG Interpretation None      MDM   Final diagnoses:  None   Normal neurologic exam. Patient is at his baseline mental status. He has no evidence of any type of head or neck trauma. The observed in emergency department. Will discharge back to nursing home. Return precautions given.     Loren Racer, MD 06/20/14 [phone]

## 2014-06-17 NOTE — ED Notes (Signed)
Per EMS, pt had an unwittesed fall at his assisted living facility ([facility] Pt denies hitting his head and LOC. Pt reports lower midline back pain. Pt was diagnosed with a L2 compression fracture 3 days ago. NAD at this time. Pt alert x3. Disoriented to time.

## 2014-06-18 ENCOUNTER — Other Ambulatory Visit: Payer: Self-pay | Admitting: Internal Medicine

## 2014-06-18 DIAGNOSIS — S32050A Wedge compression fracture of fifth lumbar vertebra, initial encounter for closed fracture: Secondary | ICD-10-CM

## 2014-06-23 ENCOUNTER — Other Ambulatory Visit: Payer: Self-pay | Admitting: Internal Medicine

## 2014-06-23 ENCOUNTER — Other Ambulatory Visit: Payer: Medicare Other

## 2014-06-23 DIAGNOSIS — S32050A Wedge compression fracture of fifth lumbar vertebra, initial encounter for closed fracture: Secondary | ICD-10-CM

## 2014-06-27 ENCOUNTER — Encounter (HOSPITAL_COMMUNITY): Payer: Self-pay | Admitting: Emergency Medicine

## 2014-06-27 ENCOUNTER — Observation Stay (HOSPITAL_COMMUNITY)
Admission: EM | Admit: 2014-06-27 | Discharge: 2014-06-29 | Disposition: A | Discharge status: Home / Self Care | Payer: Medicare Other | Attending: Internal Medicine | Admitting: Internal Medicine

## 2014-06-27 ENCOUNTER — Emergency Department (HOSPITAL_COMMUNITY): Payer: Medicare Other

## 2014-06-27 DIAGNOSIS — I4891 Unspecified atrial fibrillation: Secondary | ICD-10-CM | POA: Diagnosis not present

## 2014-06-27 DIAGNOSIS — F0390 Unspecified dementia without behavioral disturbance: Secondary | ICD-10-CM

## 2014-06-27 DIAGNOSIS — I10 Essential (primary) hypertension: Secondary | ICD-10-CM | POA: Diagnosis not present

## 2014-06-27 DIAGNOSIS — Z79899 Other long term (current) drug therapy: Secondary | ICD-10-CM | POA: Diagnosis not present

## 2014-06-27 DIAGNOSIS — R001 Bradycardia, unspecified: Secondary | ICD-10-CM

## 2014-06-27 DIAGNOSIS — S32000S Wedge compression fracture of unspecified lumbar vertebra, sequela: Secondary | ICD-10-CM

## 2014-06-27 DIAGNOSIS — M1990 Unspecified osteoarthritis, unspecified site: Secondary | ICD-10-CM | POA: Insufficient documentation

## 2014-06-27 DIAGNOSIS — Y92099 Unspecified place in other non-institutional residence as the place of occurrence of the external cause: Secondary | ICD-10-CM | POA: Insufficient documentation

## 2014-06-27 DIAGNOSIS — W19XXXA Unspecified fall, initial encounter: Secondary | ICD-10-CM | POA: Diagnosis not present

## 2014-06-27 DIAGNOSIS — G309 Alzheimer's disease, unspecified: Secondary | ICD-10-CM | POA: Diagnosis not present

## 2014-06-27 DIAGNOSIS — S32000A Wedge compression fracture of unspecified lumbar vertebra, initial encounter for closed fracture: Secondary | ICD-10-CM

## 2014-06-27 DIAGNOSIS — S0990XA Unspecified injury of head, initial encounter: Secondary | ICD-10-CM | POA: Diagnosis not present

## 2014-06-27 DIAGNOSIS — M542 Cervicalgia: Secondary | ICD-10-CM | POA: Insufficient documentation

## 2014-06-27 DIAGNOSIS — M545 Low back pain: Secondary | ICD-10-CM | POA: Diagnosis present

## 2014-06-27 DIAGNOSIS — Y92009 Unspecified place in unspecified non-institutional (private) residence as the place of occurrence of the external cause: Secondary | ICD-10-CM

## 2014-06-27 DIAGNOSIS — F0280 Dementia in other diseases classified elsewhere without behavioral disturbance: Secondary | ICD-10-CM | POA: Diagnosis not present

## 2014-06-27 DIAGNOSIS — Z7982 Long term (current) use of aspirin: Secondary | ICD-10-CM | POA: Insufficient documentation

## 2014-06-27 DIAGNOSIS — M4856XD Collapsed vertebra, not elsewhere classified, lumbar region, subsequent encounter for fracture with routine healing: Secondary | ICD-10-CM | POA: Diagnosis not present

## 2014-06-27 DIAGNOSIS — I951 Orthostatic hypotension: Secondary | ICD-10-CM | POA: Diagnosis not present

## 2014-06-27 DIAGNOSIS — I509 Heart failure, unspecified: Secondary | ICD-10-CM | POA: Insufficient documentation

## 2014-06-27 LAB — COMPREHENSIVE METABOLIC PANEL
ALK PHOS: 203 U/L — AB (ref 39–117)
ALT: 41 U/L (ref 0–53)
AST: 47 U/L — AB (ref 0–37)
Albumin: 3.4 g/dL — ABNORMAL LOW (ref 3.5–5.2)
Anion gap: 15 (ref 5–15)
BUN: 48 mg/dL — ABNORMAL HIGH (ref 6–23)
CALCIUM: 9.1 mg/dL (ref 8.4–10.5)
CO2: 22 mEq/L (ref 19–32)
Chloride: 101 mEq/L (ref 96–112)
Creatinine, Ser: 1.26 mg/dL (ref 0.50–1.35)
GFR calc non Af Amer: 50 mL/min — ABNORMAL LOW (ref >90)
GFR, EST AFRICAN AMERICAN: 58 mL/min — AB (ref >90)
GLUCOSE: 123 mg/dL — AB (ref 70–99)
POTASSIUM: 4.5 meq/L (ref 3.7–5.3)
SODIUM: 138 meq/L (ref 137–147)
Total Bilirubin: 1.2 mg/dL (ref 0.3–1.2)
Total Protein: 6.7 g/dL (ref 6.0–8.3)

## 2014-06-27 LAB — CBC WITH DIFFERENTIAL/PLATELET
BASOS ABS: 0 10*3/uL (ref 0.0–0.1)
Basophils Relative: 0 % (ref 0–1)
EOS ABS: 0.5 10*3/uL (ref 0.0–0.7)
EOS PCT: 4 % (ref 0–5)
HCT: 42 % (ref 39.0–52.0)
Hemoglobin: 15 g/dL (ref 13.0–17.0)
LYMPHS ABS: 3.2 10*3/uL (ref 0.7–4.0)
Lymphocytes Relative: 25 % (ref 12–46)
MCH: 34.8 pg — ABNORMAL HIGH (ref 26.0–34.0)
MCHC: 35.7 g/dL (ref 30.0–36.0)
MCV: 97.4 fL (ref 78.0–100.0)
Monocytes Absolute: 1 10*3/uL (ref 0.1–1.0)
Monocytes Relative: 8 % (ref 3–12)
NEUTROS PCT: 63 % (ref 43–77)
Neutro Abs: 8.3 10*3/uL — ABNORMAL HIGH (ref 1.7–7.7)
PLATELETS: 246 10*3/uL (ref 150–400)
RBC: 4.31 MIL/uL (ref 4.22–5.81)
RDW: 13.4 % (ref 11.5–15.5)
WBC: 13 10*3/uL — AB (ref 4.0–10.5)

## 2014-06-27 LAB — I-STAT TROPONIN, ED: Troponin i, poc: 0.05 ng/mL (ref 0.00–0.08)

## 2014-06-27 LAB — URINALYSIS, ROUTINE W REFLEX MICROSCOPIC
Bilirubin Urine: NEGATIVE
GLUCOSE, UA: NEGATIVE mg/dL
Hgb urine dipstick: NEGATIVE
KETONES UR: NEGATIVE mg/dL
LEUKOCYTES UA: NEGATIVE
Nitrite: NEGATIVE
PH: 5.5 (ref 5.0–8.0)
Protein, ur: NEGATIVE mg/dL
SPECIFIC GRAVITY, URINE: 1.017 (ref 1.005–1.030)
Urobilinogen, UA: 0.2 mg/dL (ref 0.0–1.0)

## 2014-06-27 MED ORDER — TAMSULOSIN HCL 0.4 MG PO CAPS
0.4000 mg | ORAL_CAPSULE | Freq: Every day | ORAL | Status: DC
  Administered 2014-06-27: 0.4 mg via ORAL
  Filled 2014-06-27 (×2): qty 1

## 2014-06-27 MED ORDER — POTASSIUM CHLORIDE CRYS ER 20 MEQ PO TBCR
20.0000 meq | EXTENDED_RELEASE_TABLET | Freq: Every day | ORAL | Status: DC
  Administered 2014-06-27 – 2014-06-29 (×3): 20 meq via ORAL
  Filled 2014-06-27 (×3): qty 1

## 2014-06-27 MED ORDER — ONDANSETRON HCL 4 MG PO TABS: 4.0000 mg | ORAL_TABLET | Freq: Four times a day (QID) | ORAL | Status: DC | PRN

## 2014-06-27 MED ORDER — DUTASTERIDE-TAMSULOSIN HCL 0.5-0.4 MG PO CAPS: 1.0000 | ORAL_CAPSULE | Freq: Every day | ORAL | Status: DC

## 2014-06-27 MED ORDER — OXYCODONE-ACETAMINOPHEN 5-325 MG PO TABS
1.0000 | ORAL_TABLET | Freq: Once | ORAL | Status: AC
  Administered 2014-06-27: 1 via ORAL
  Filled 2014-06-27: qty 1

## 2014-06-27 MED ORDER — ALBUTEROL SULFATE (2.5 MG/3ML) 0.083% IN NEBU
2.5000 mg | INHALATION_SOLUTION | Freq: Two times a day (BID) | RESPIRATORY_TRACT | Status: DC
  Administered 2014-06-27 – 2014-06-29 (×3): 2.5 mg via RESPIRATORY_TRACT
  Filled 2014-06-27 (×3): qty 3

## 2014-06-27 MED ORDER — DUTASTERIDE 0.5 MG PO CAPS
0.5000 mg | ORAL_CAPSULE | Freq: Every day | ORAL | Status: DC
  Administered 2014-06-27 – 2014-06-28 (×2): 0.5 mg via ORAL
  Filled 2014-06-27 (×3): qty 1

## 2014-06-27 MED ORDER — TRAMADOL HCL 50 MG PO TABS: 50.0000 mg | ORAL_TABLET | Freq: Three times a day (TID) | ORAL | Status: DC | PRN

## 2014-06-27 MED ORDER — ACETAMINOPHEN 325 MG PO TABS: 650.0000 mg | ORAL_TABLET | Freq: Four times a day (QID) | ORAL | Status: DC | PRN

## 2014-06-27 MED ORDER — ENOXAPARIN SODIUM 40 MG/0.4ML ~~LOC~~ SOLN
40.0000 mg | SUBCUTANEOUS | Status: DC
Start: 2014-06-27 — End: 2014-06-29
  Administered 2014-06-27 – 2014-06-28 (×2): 40 mg via SUBCUTANEOUS
  Filled 2014-06-27 (×3): qty 0.4

## 2014-06-27 MED ORDER — DONEPEZIL HCL 10 MG PO TABS
10.0000 mg | ORAL_TABLET | Freq: Every day | ORAL | Status: DC
  Administered 2014-06-27: 10 mg via ORAL
  Filled 2014-06-27 (×2): qty 1

## 2014-06-27 MED ORDER — MELOXICAM 15 MG PO TABS
15.0000 mg | ORAL_TABLET | Freq: Every day | ORAL | Status: DC
  Administered 2014-06-27 – 2014-06-29 (×3): 15 mg via ORAL
  Filled 2014-06-27 (×3): qty 1

## 2014-06-27 MED ORDER — NEBIVOLOL HCL 2.5 MG PO TABS
2.5000 mg | ORAL_TABLET | Freq: Every day | ORAL | Status: DC
  Administered 2014-06-27: 2.5 mg via ORAL
  Filled 2014-06-27 (×2): qty 1

## 2014-06-27 MED ORDER — ASPIRIN 81 MG PO CHEW
81.0000 mg | CHEWABLE_TABLET | Freq: Every day | ORAL | Status: DC
  Administered 2014-06-28 – 2014-06-29 (×2): 81 mg via ORAL
  Filled 2014-06-27 (×2): qty 1

## 2014-06-27 MED ORDER — MEMANTINE HCL 10 MG PO TABS
10.0000 mg | ORAL_TABLET | Freq: Two times a day (BID) | ORAL | Status: DC
  Administered 2014-06-27: 10 mg via ORAL
  Filled 2014-06-27 (×3): qty 1

## 2014-06-27 MED ORDER — ACETAMINOPHEN 650 MG RE SUPP: 650.0000 mg | Freq: Four times a day (QID) | RECTAL | Status: DC | PRN

## 2014-06-27 MED ORDER — ONDANSETRON HCL 4 MG/2ML IJ SOLN: 4.0000 mg | Freq: Four times a day (QID) | INTRAMUSCULAR | Status: DC | PRN

## 2014-06-27 MED ORDER — LOPERAMIDE HCL 2 MG PO CAPS
2.0000 mg | ORAL_CAPSULE | Freq: Four times a day (QID) | ORAL | Status: DC | PRN
  Filled 2014-06-27: qty 1

## 2014-06-27 MED ORDER — FUROSEMIDE 40 MG PO TABS
40.0000 mg | ORAL_TABLET | Freq: Two times a day (BID) | ORAL | Status: DC
  Administered 2014-06-27 – 2014-06-28 (×2): 40 mg via ORAL
  Filled 2014-06-27 (×4): qty 1

## 2014-06-27 MED ORDER — LISINOPRIL 5 MG PO TABS
5.0000 mg | ORAL_TABLET | Freq: Every day | ORAL | Status: DC
  Administered 2014-06-27: 5 mg via ORAL
  Filled 2014-06-27 (×2): qty 1

## 2014-06-27 NOTE — ED Notes (Signed)
Patient transported to CT and xray 

## 2014-06-27 NOTE — ED Notes (Signed)
[facility]. Pt c/o lower back pain. Denies any numbness or tingling down legs. Denies hitting head and LOC. Staff stated to EMS that pt has been having more falls recently. BP-164/86 HR-60 CGB-114 Resp-18 Pt is A&Ox 4.

## 2014-06-27 NOTE — ED Notes (Signed)
Pt still out of department for testing

## 2014-06-27 NOTE — ED Provider Notes (Signed)
CSN: [id number]     Arrival date & time 06/27/14  7425 History   First MD Initiated Contact with Patient 06/27/14 [phone]     Chief Complaint  Patient presents with  . Fall  . Back Pain     (Consider location/radiation/quality/duration/timing/severity/associated sxs/prior Treatment) HPI Sean Carlson is an 80 year old male with past medical history of hypertension, dementia, osteoarthritis who presents to the ER after a fall. Patient lives at an assisted living facility, and according to staff they found patient lying on the floor of his apartment bedroom. Staff states they sent patient out to be evaluated. Staff of the facility reports that when they found patient he was acting at his baseline mentation. Patient states he does not recall falling, and does not recall any events that happened leading up to the fall. Patient states he only recalls being in the hospital right now, and that "my back hurts". Patient complains of lower lumbar back pain, right-sided hip pain. Patient denies any headache, dizziness, neck pain, shortness of breath, chest pain, abdominal pain, Past Medical History  Diagnosis Date  . Hypertension   . Dementia   . Osteoarthritis    Past Surgical History  Procedure Laterality Date  . Appendectomy     No family history on file. History  Substance Use Topics  . Smoking status: Never Smoker   . Smokeless tobacco: Not on file  . Alcohol Use: Yes     Comment: occasionally    Review of Systems  Constitutional: Negative for fever.  HENT: Negative for trouble swallowing.   Eyes: Negative for visual disturbance.  Respiratory: Negative for shortness of breath.   Cardiovascular: Negative for chest pain.  Gastrointestinal: Negative for nausea, vomiting and abdominal pain.  Genitourinary: Negative for dysuria.  Musculoskeletal: Positive for back pain. Negative for neck pain.  Skin: Negative for rash.  Neurological: Negative for dizziness, weakness and numbness.    Psychiatric/Behavioral: Negative.       Allergies  Review of patient's allergies indicates no known allergies.  Home Medications   Prior to Admission medications   Medication Sig Start Date End Date Taking? Authorizing Provider  albuterol (PROVENTIL) (2.5 MG/3ML) 0.083% nebulizer solution Take 2.5 mg by nebulization 2 (two) times daily.    Historical Provider, MD  aspirin 81 MG chewable tablet Chew 81 mg by mouth daily.    Historical Provider, MD  ciprofloxacin (CIPRO) 250 MG tablet  06/19/14   Historical Provider, MD  donepezil (ARICEPT) 10 MG tablet Take 10 mg by mouth at bedtime.    Historical Provider, MD  Dutasteride-Tamsulosin HCl (JALYN) 0.5-0.4 MG CAPS Take 1 capsule by mouth at bedtime.     Historical Provider, MD  furosemide (LASIX) 40 MG tablet Take 1 tablet (40 mg total) by mouth 2 (two) times daily. 03/24/14   Katy Apo, MD  lisinopril (PRINIVIL,ZESTRIL) 5 MG tablet Take 5 mg by mouth daily.    Historical Provider, MD  loperamide (IMODIUM) 2 MG capsule Take 1 capsule (2 mg total) by mouth 4 (four) times daily as needed for diarrhea or loose stools. 01/10/14   Gerhard Munch, MD  meloxicam (MOBIC) 15 MG tablet Take 1 tablet (15 mg total) by mouth daily. 06/16/14   Gerhard Munch, MD  memantine (NAMENDA) 10 MG tablet Take 10 mg by mouth 2 (two) times daily.    Historical Provider, MD  nebivolol (BYSTOLIC) 2.5 MG tablet Take 1 tablet (2.5 mg total) by mouth daily. 03/24/14   Katy Apo, MD  potassium chloride  SA (K-DUR,KLOR-CON) 20 MEQ tablet Take 1 tablet (20 mEq total) by mouth daily. 03/24/14   Katy Apoonald D Polite, MD  traMADol (ULTRAM) 50 MG tablet Take 50 mg by mouth every 8 (eight) hours as needed.    Historical Provider, MD   BP 121/92  Pulse 76  Temp(Src) 98.3 F (36.8 C) (Oral)  Resp 16  SpO2 92% Physical Exam  Nursing note and vitals reviewed. Constitutional: He appears well-developed and well-nourished. No distress.  HENT:  Head: Normocephalic and  atraumatic.  Mouth/Throat: Oropharynx is clear and moist. No oropharyngeal exudate.  Eyes: Right eye exhibits no discharge. Left eye exhibits no discharge. No scleral icterus.  Neck: Normal range of motion.  Cardiovascular: Normal rate, regular rhythm and normal heart sounds.   No murmur heard. Pulmonary/Chest: Effort normal and breath sounds normal. No respiratory distress.  Abdominal: Soft. There is no tenderness.  Musculoskeletal: Normal range of motion. He exhibits no edema and no tenderness.       Back:  Pain to palpation of L3-S1 region and to ASIS.  Limited ROM due to pain.  No obvious signs of trauma.  No step-offs, deformities.  Pelvis stable.   Neurological: He has normal strength. No cranial nerve deficit or sensory deficit. Coordination normal. GCS eye subscore is 4. GCS verbal subscore is 5. GCS motor subscore is 6.  Patient fully alert answering questions in full, clear sentences. Patient is alert to person and place only. Motor strength 5 out of 5 in all major muscle groups of upper and lower extremities.   Skin: Skin is warm and dry. No rash noted. He is not diaphoretic.  Psychiatric: He has a normal mood and affect.    ED Course  Procedures (including critical care time) Labs Review Labs Reviewed  CBC WITH DIFFERENTIAL - Abnormal; Notable for the following:    WBC 13.0 (*)    MCH 34.8 (*)    Neutro Abs 8.3 (*)    All other components within normal limits  COMPREHENSIVE METABOLIC PANEL - Abnormal; Notable for the following:    Glucose, Bld 123 (*)    BUN 48 (*)    Albumin 3.4 (*)    AST 47 (*)    Alkaline Phosphatase 203 (*)    GFR calc non Af Amer 50 (*)    GFR calc Af Amer 58 (*)    All other components within normal limits  URINALYSIS, ROUTINE W REFLEX MICROSCOPIC  I-STAT TROPOININ, ED    Imaging Review Dg Ribs Unilateral W/chest Right  06/27/2014   CLINICAL DATA:  Fall at nursing home. No loss of consciousness. Fall from the same level. The patient could  not hit anything while falling. Right-sided rib pain. Low back pain.  EXAM: RIGHT RIBS AND CHEST - 3+ VIEW  COMPARISON:  One-view chest 03/21/2014.  FINDINGS: The heart size is exaggerated by low lung volumes.  Lungs are clear.  Dedicated imaging of the ribs demonstrates callus formation about a lateral right ninth rib fracture. There is no pneumothorax. No acute fractures are evident.  IMPRESSION: 1. Healing are healed right lateral ninth rib fracture. 2. No acute fracture. 3. No acute cardiopulmonary disease.   Electronically Signed   By: Gennette Pachris  Mattern M.D.   On: 06/27/2014 11:04   Dg Lumbar Spine Complete  06/27/2014   CLINICAL DATA:  Fall with low back pain. Recent L4 compression fracture.  EXAM: LUMBAR SPINE - COMPLETE 4+ VIEW  COMPARISON:  06/16/2014  FINDINGS: Degree of compression of the central  L4 vertebral body appears slightly more prominent compared to the prior study. However, the total amount of vertebral body compression still is likely just under 50% loss of height. The mild compression of the L1 vertebral body appears stable and is likely an old compression fracture. No new fracture identified. No evidence of subluxation.  IMPRESSION: Mildly more prominent appearance to compression of the L4 vertebral body. Overall loss of height still remains at or just below 50%.   Electronically Signed   By: Irish Lack M.D.   On: 06/27/2014 11:03   Dg Pelvis 1-2 Views  06/27/2014   CLINICAL DATA:  Pain post trauma  EXAM: PELVIS - 1-2 VIEW  COMPARISON:  Lumbar spine series June 16, 2014  FINDINGS: There is no evidence of pelvic fracture or dislocation. There is extensive osteoarthritic change in the right hip joint. There is moderate osteoarthritic change in the left hip joint. There is periarticular osteoporosis. There are foci of vascular calcification bilaterally. There is stable compression of the L4 vertebral body.  IMPRESSION: Extensive osteoarthritic change right hip. Moderate  osteoarthritic change left hip. No acute fracture or dislocation apparent. Stable wedge compression L4 vertebral body.   Electronically Signed   By: Bretta Bang M.D.   On: 06/27/2014 10:58   Ct Head Wo Contrast  06/27/2014   CLINICAL DATA:  Fall with head injury and neck pain.  EXAM: CT HEAD WITHOUT CONTRAST  CT CERVICAL SPINE WITHOUT CONTRAST  TECHNIQUE: Multidetector CT imaging of the head and cervical spine was performed following the standard protocol without intravenous contrast. Multiplanar CT image reconstructions of the cervical spine were also generated.  COMPARISON:  CT of the head on 12/21/2010.  FINDINGS: CT HEAD FINDINGS  There is some progression of cortical atrophy and deep white matter small vessel disease since the prior CT. The brain demonstrates no evidence of hemorrhage, infarction, edema, mass effect, extra-axial fluid collection, hydrocephalus or mass lesion. The skull is unremarkable.  CT CERVICAL SPINE FINDINGS  The cervical spine shows normal alignment. There is no evidence of acute fracture or subluxation. No soft tissue swelling or hematoma is identified. Degenerative changes present of the cervical spine, most prominently at C6-7. No bony or soft tissue lesions are seen. Heavily calcified plaque is present at both carotid bifurcations. The visualized airway is normally patent.  IMPRESSION: 1. Progression of cortical atrophy and small vessel disease of the brain. No acute findings by head CT. 2. No evidence of cervical fracture. Degenerative changes present as well as carotid atherosclerosis.   Electronically Signed   By: Irish Lack M.D.   On: 06/27/2014 10:38   Ct Cervical Spine Wo Contrast  06/27/2014   CLINICAL DATA:  Fall with head injury and neck pain.  EXAM: CT HEAD WITHOUT CONTRAST  CT CERVICAL SPINE WITHOUT CONTRAST  TECHNIQUE: Multidetector CT imaging of the head and cervical spine was performed following the standard protocol without intravenous contrast.  Multiplanar CT image reconstructions of the cervical spine were also generated.  COMPARISON:  CT of the head on 12/21/2010.  FINDINGS: CT HEAD FINDINGS  There is some progression of cortical atrophy and deep white matter small vessel disease since the prior CT. The brain demonstrates no evidence of hemorrhage, infarction, edema, mass effect, extra-axial fluid collection, hydrocephalus or mass lesion. The skull is unremarkable.  CT CERVICAL SPINE FINDINGS  The cervical spine shows normal alignment. There is no evidence of acute fracture or subluxation. No soft tissue swelling or hematoma is identified. Degenerative changes present  of the cervical spine, most prominently at C6-7. No bony or soft tissue lesions are seen. Heavily calcified plaque is present at both carotid bifurcations. The visualized airway is normally patent.  IMPRESSION: 1. Progression of cortical atrophy and small vessel disease of the brain. No acute findings by head CT. 2. No evidence of cervical fracture. Degenerative changes present as well as carotid atherosclerosis.   Electronically Signed   By: Irish LackGlenn  Yamagata M.D.   On: 06/27/2014 10:38     EKG Interpretation None      MDM   Final diagnoses:  Fall  Lumbar compression fracture, closed, subsequent encounter    [age] yom with pmhx of Alzheimer's Dementia who presents to the ER s/p unwitnessed fall. Pt unable to recall the event.  W/u with CBC, CMP, UA, EKG, CT head/neck.  Pt c/o pain in L spine, R ribs, R ASIS.  Will obtain imaging of these as well.    Radiographs remarkable for worsening L4 compression fx with 50% height decrease compared to 40% last month.  Pt unable to tell if his pain is under control-- does not remember receiving pain medication while in the ED. Will place consult to medicine for admission for pain control/obs  +/- more imaging as pt is scheduled for OP MR L spine in 4 days by PCP.   2:17 PM: Medicine agrees to admit to MedSurg.The patient appears  reasonably stabilized for admission considering the current resources, flow, and capabilities available in the ED at this time, and I doubt any other [facility] requiring further screening and/or treatment in the ED prior to admission.  BP 121/92  Pulse 76  Temp(Src) 98.3 F (36.8 C) (Oral)  Resp 16  SpO2 92%  Signed,  Sean MowJoe Keshonda Monsour, PA-C 4:29 PM  This patient seen and discussed with Dr. Benjiman CoreNathan Pickering, MD.   Sean FantasiaJoseph W Corbyn Steedman, PA-C 06/27/14 [phone]

## 2014-06-27 NOTE — H&P (Addendum)
[facility] History and Physical  Warden FillersDonald Ausburn ZOX:[id number] DOB: [birth year] DOA: 06/27/2014  Referring physician: Dr Rubin PayorPickering PCP: [name],[name], MD   Chief Complaint:  Fall at assist living   HPI:  80 year old male with history of hypertension, osteoarthritis, advanced Alzheimer's dementia, CHF and new onset A. fib who was at as a dental to assist living was sent to the ED after a possible mechanical fall today. As per the son who provided the history patient has had multiple falls at the assisted-living over past 2 weeks. He had 2 mechanical falls following which he saw his PCP last week and [facility], the result of which is not available) . He then had a fall at the assisted-living again today and possibly hit his low back and neck. The staff at the assisted-living found him lying on the floor. Patient was not found to have any change in mental status, bleeding, bowel or urinary incontinence. Patient has advanced dementia and is unable to provide much history.  Patient denies headache, dizziness, fever, nausea , vomiting, chest pain,SOB, abdominal pain, bowel or urinary symptoms.   In the ED patient's vitals were stable. But will go to WBC of 13 and BUN of 48. X-ray of the lumbar spine showed compression fracture of L4 vertebral body which was more prominent appearing. Head CT, CT of the cervical spine, history of the pelvis was unremarkable for acute injury. X-ray of the hips showed or healing right rib fracture. Hospitalist admission requested for observation, pain control and physical therapy evaluation     Review of Systems:  Limited due to patient's dementia. As outlined in history of present illness   Past Medical History  Diagnosis Date  . Hypertension   . Dementia   . Osteoarthritis    Past Surgical History  Procedure Laterality Date  . Appendectomy     Social History:  reports that he has never  smoked. He does not have any smokeless tobacco history on file. He reports that he drinks alcohol. He reports that he does not use illicit drugs.  No Known Allergies  No family history on file.  Prior to Admission medications   Medication Sig Start Date End Date Taking? Authorizing Provider  albuterol (PROVENTIL) (2.5 MG/3ML) 0.083% nebulizer solution Take 2.5 mg by nebulization 2 (two) times daily.    Historical Provider, MD  aspirin 81 MG chewable tablet Chew 81 mg by mouth daily.    Historical Provider, MD  donepezil (ARICEPT) 10 MG tablet Take 10 mg by mouth at bedtime.    Historical Provider, MD  Dutasteride-Tamsulosin HCl (JALYN) 0.5-0.4 MG CAPS Take 1 capsule by mouth at bedtime.     Historical Provider, MD  furosemide (LASIX) 40 MG tablet Take 1 tablet (40 mg total) by mouth 2 (two) times daily. 03/24/14   Katy Apoonald D Polite, MD  lisinopril (PRINIVIL,ZESTRIL) 5 MG tablet Take 5 mg by mouth daily.    Historical Provider, MD  loperamide (IMODIUM) 2 MG capsule Take 1 capsule (2 mg total) by mouth 4 (four) times daily as needed for diarrhea or loose stools. 01/10/14   Gerhard Munchobert Lockwood, MD  meloxicam (MOBIC) 15 MG tablet Take 1 tablet (15 mg total) by mouth daily. 06/16/14   Gerhard Munchobert Lockwood, MD  memantine (NAMENDA) 10 MG tablet Take 10 mg by mouth 2 (two) times daily.    Historical Provider, MD  nebivolol (BYSTOLIC) 2.5 MG tablet Take 1 tablet (2.5 mg total) by  mouth daily. 03/24/14   Katy Apoonald D Polite, MD  potassium chloride SA (K-DUR,KLOR-CON) 20 MEQ tablet Take 1 tablet (20 mEq total) by mouth daily. 03/24/14   Katy Apoonald D Polite, MD  traMADol (ULTRAM) 50 MG tablet Take 50 mg by mouth every 8 (eight) hours as needed.    Historical Provider, MD     Physical Exam:  Filed Vitals:   06/27/14 1315 06/27/14 1411 06/27/14 1500 06/27/14 1536  BP: 113/53 109/95 130/89 121/92  Pulse: 93 77 66 76  Temp:    98.3 F (36.8 C)  TempSrc:    Oral  Resp: 22 15 19 16   SpO2: 98% 99% 100% 92%    Constitutional:  Vital signs reviewed.  Elderly male lying in bed in no acute distress  HEENT: no pallor,  moist oral mucosa,  Cardiovascular: RRR, S1 normal, S2 normal, no MRG Chest: CTAB, no wheezes, rales, or rhonchi Abdominal: Soft. Non-tender, non-distended, bowel sounds are normal, Ext: warm, no edema, SLTR negative, no low back tenderness to pressure Neurological: Alert and oriented to self, no focal deficit  Labs on Admission:  Basic Metabolic Panel:  Recent Labs Lab 06/27/14 0925  NA 138  K 4.5  CL 101  CO2 22  GLUCOSE 123*  BUN 48*  CREATININE 1.26  CALCIUM 9.1   Liver Function Tests:  Recent Labs Lab 06/27/14 0925  AST 47*  ALT 41  ALKPHOS 203*  BILITOT 1.2  PROT 6.7  ALBUMIN 3.4*   No results found for this basename: LIPASE, AMYLASE,  in the last 168 hours No results found for this basename: AMMONIA,  in the last 168 hours CBC:  Recent Labs Lab 06/27/14 0925  WBC 13.0*  NEUTROABS 8.3*  HGB 15.0  HCT 42.0  MCV 97.4  PLT 246   Cardiac Enzymes: No results found for this basename: CKTOTAL, CKMB, CKMBINDEX, TROPONINI,  in the last 168 hours BNP: No components found with this basename: POCBNP,  CBG: No results found for this basename: GLUCAP,  in the last 168 hours  Radiological Exams on Admission: Dg Ribs Unilateral W/chest Right  06/27/2014   CLINICAL DATA:  Fall at nursing home. No loss of consciousness. Fall from the same level. The patient could not hit anything while falling. Right-sided rib pain. Low back pain.  EXAM: RIGHT RIBS AND CHEST - 3+ VIEW  COMPARISON:  One-view chest 03/21/2014.  FINDINGS: The heart size is exaggerated by low lung volumes.  Lungs are clear.  Dedicated imaging of the ribs demonstrates callus formation about a lateral right ninth rib fracture. There is no pneumothorax. No acute fractures are evident.  IMPRESSION: 1. Healing are healed right lateral ninth rib fracture. 2. No acute fracture. 3. No acute cardiopulmonary disease.    Electronically Signed   By: Gennette Pachris  Mattern M.D.   On: 06/27/2014 11:04   Dg Lumbar Spine Complete  06/27/2014   CLINICAL DATA:  Fall with low back pain. Recent L4 compression fracture.  EXAM: LUMBAR SPINE - COMPLETE 4+ VIEW  COMPARISON:  06/16/2014  FINDINGS: Degree of compression of the central L4 vertebral body appears slightly more prominent compared to the prior study. However, the total amount of vertebral body compression still is likely just under 50% loss of height. The mild compression of the L1 vertebral body appears stable and is likely an old compression fracture. No new fracture identified. No evidence of subluxation.  IMPRESSION: Mildly more prominent appearance to compression of the L4 vertebral body. Overall loss of height still remains  at or just below 50%.   Electronically Signed   By: Irish Lack M.D.   On: 06/27/2014 11:03   Dg Pelvis 1-2 Views  06/27/2014   CLINICAL DATA:  Pain post trauma  EXAM: PELVIS - 1-2 VIEW  COMPARISON:  Lumbar spine series June 16, 2014  FINDINGS: There is no evidence of pelvic fracture or dislocation. There is extensive osteoarthritic change in the right hip joint. There is moderate osteoarthritic change in the left hip joint. There is periarticular osteoporosis. There are foci of vascular calcification bilaterally. There is stable compression of the L4 vertebral body.  IMPRESSION: Extensive osteoarthritic change right hip. Moderate osteoarthritic change left hip. No acute fracture or dislocation apparent. Stable wedge compression L4 vertebral body.   Electronically Signed   By: Bretta Bang M.D.   On: 06/27/2014 10:58   Ct Head Wo Contrast  06/27/2014   CLINICAL DATA:  Fall with head injury and neck pain.  EXAM: CT HEAD WITHOUT CONTRAST  CT CERVICAL SPINE WITHOUT CONTRAST  TECHNIQUE: Multidetector CT imaging of the head and cervical spine was performed following the standard protocol without intravenous contrast. Multiplanar CT image  reconstructions of the cervical spine were also generated.  COMPARISON:  CT of the head on 12/21/2010.  FINDINGS: CT HEAD FINDINGS  There is some progression of cortical atrophy and deep white matter small vessel disease since the prior CT. The brain demonstrates no evidence of hemorrhage, infarction, edema, mass effect, extra-axial fluid collection, hydrocephalus or mass lesion. The skull is unremarkable.  CT CERVICAL SPINE FINDINGS  The cervical spine shows normal alignment. There is no evidence of acute fracture or subluxation. No soft tissue swelling or hematoma is identified. Degenerative changes present of the cervical spine, most prominently at C6-7. No bony or soft tissue lesions are seen. Heavily calcified plaque is present at both carotid bifurcations. The visualized airway is normally patent.  IMPRESSION: 1. Progression of cortical atrophy and small vessel disease of the brain. No acute findings by head CT. 2. No evidence of cervical fracture. Degenerative changes present as well as carotid atherosclerosis.   Electronically Signed   By: Irish Lack M.D.   On: 06/27/2014 10:38   Ct Cervical Spine Wo Contrast  06/27/2014   CLINICAL DATA:  Fall with head injury and neck pain.  EXAM: CT HEAD WITHOUT CONTRAST  CT CERVICAL SPINE WITHOUT CONTRAST  TECHNIQUE: Multidetector CT imaging of the head and cervical spine was performed following the standard protocol without intravenous contrast. Multiplanar CT image reconstructions of the cervical spine were also generated.  COMPARISON:  CT of the head on 12/21/2010.  FINDINGS: CT HEAD FINDINGS  There is some progression of cortical atrophy and deep white matter small vessel disease since the prior CT. The brain demonstrates no evidence of hemorrhage, infarction, edema, mass effect, extra-axial fluid collection, hydrocephalus or mass lesion. The skull is unremarkable.  CT CERVICAL SPINE FINDINGS  The cervical spine shows normal alignment. There is no evidence of  acute fracture or subluxation. No soft tissue swelling or hematoma is identified. Degenerative changes present of the cervical spine, most prominently at C6-7. No bony or soft tissue lesions are seen. Heavily calcified plaque is present at both carotid bifurcations. The visualized airway is normally patent.  IMPRESSION: 1. Progression of cortical atrophy and small vessel disease of the brain. No acute findings by head CT. 2. No evidence of cervical fracture. Degenerative changes present as well as carotid atherosclerosis.   Electronically Signed   By: Sherrine Maples  Fredia Sorrow M.D.   On: 06/27/2014 10:38      Assessment/Plan Principal Problem:   Lumbar compression fracture X-ray shows more pronounced at L4 compression fracture but unclear if it is acute. Check orthostatic vitals. UA unremarkable Pain control with tylenol and prn tramadol. Will avoid narcotics. Add prn robaxin for muscle spasm. Physical therapy evaluation. -If stable and safe for discharge can be discharged back to assist living with physical therapy tomorrow. -MRI results can be followed up as outpatient -Maintain fall precautions  Active Problems:    CHF (congestive heart failure) Continue aspirin, Lasix and lisinopril. Continue Bystolic Euvolemic. Continue home medications    Alzheimer's Dementia Moderate to severe, continue medications    Hypertension Stable    Diet:cardiac  DVT prophylaxis: sq lovenox   Code Status: Full code Family Communication: Spoke with son at bedside. Please contact Mali Guimond to update and inform when patient will be discharged . Patient's son lives out of town and has to arrange to pick him up for discharge.  Cell phone :  [phone]  Disposition Plan: home possibly tomorrow after seen by PT  Eddie North [facility] Pager [phone]  Total time spent on admission :45 minutes  If 7PM-7AM, please contact night-coverage [email or web address] Password [email or web address] 06/27/2014, 4:54  PM

## 2014-06-27 NOTE — ED Notes (Signed)
Pt has returned from being out of the department; pt placed back on monitor, continuous pulse oximetry and blood pressure cuff 

## 2014-06-28 DIAGNOSIS — S32000S Wedge compression fracture of unspecified lumbar vertebra, sequela: Secondary | ICD-10-CM

## 2014-06-28 DIAGNOSIS — I951 Orthostatic hypotension: Secondary | ICD-10-CM | POA: Diagnosis present

## 2014-06-28 MED ORDER — MEMANTINE HCL 10 MG PO TABS
10.0000 mg | ORAL_TABLET | Freq: Every day | ORAL | Status: DC
  Administered 2014-06-29: 10 mg via ORAL
  Filled 2014-06-28: qty 1

## 2014-06-28 MED ORDER — DONEPEZIL HCL 10 MG PO TABS
10.0000 mg | ORAL_TABLET | Freq: Every day | ORAL | Status: DC
  Administered 2014-06-28: 10 mg via ORAL
  Filled 2014-06-28 (×2): qty 1

## 2014-06-28 MED ORDER — FUROSEMIDE 40 MG PO TABS
40.0000 mg | ORAL_TABLET | Freq: Every day | ORAL | Status: DC
  Administered 2014-06-29: 40 mg via ORAL
  Filled 2014-06-28: qty 1

## 2014-06-28 MED ORDER — LISINOPRIL 2.5 MG PO TABS
2.5000 mg | ORAL_TABLET | Freq: Every day | ORAL | Status: DC
  Administered 2014-06-29: 2.5 mg via ORAL
  Filled 2014-06-28: qty 1

## 2014-06-28 NOTE — ED Provider Notes (Signed)
Medical screening examination/treatment/procedure(s) were conducted as a shared visit with non-physician practitioner(s) and myself.  I personally evaluated the patient during the encounter.   EKG Interpretation None     Patient back pain. Recent fall. Some confusion and patient lives in assisted living. Mild worsening of known lumbar compression fracture. Patient cannot tell me whether he is on any pain medicine or not. He is outpatient MRI scheduled, however it feels that the patient will benefit from admission at this time. Will admit to internal medicine  Juliet RudeNathan R. Rubin PayorPickering, MD 06/28/14 [phone]

## 2014-06-28 NOTE — Evaluation (Signed)
Physical Therapy Evaluation Patient Details Name: Warden FillersDonald Kronk MRN: [id number] DOB: [birth year] Today's Date: 06/28/2014   History of Present Illness  Pt is s/p multi falls and admitted with lumbar compression fx.  Clinical Impression  Pt admitted with above. Pt currently with functional limitations due to the deficits listed below (see PT Problem List). Min guard to min assist needed for functional mobility, including gait with RW 50'.  He demo poor safety awareness and high risk of falls.  Pt will benefit from skilled PT to increase their independence and safety with mobility to allow discharge to the venue listed below. Recommend increasing level of care at ALF.  Spoke with pt's son on the phone who reports the ALF has 3 levels of care with the pt currently being on the lowest level (level 1).  Recommend pt transitioning to level 2 or 3, depending on which level is able to provide min assist with mobility skill.      Follow Up Recommendations Home health PT;Supervision for mobility/OOB    Equipment Recommendations  None recommended by PT    Recommendations for Other Services       Precautions / Restrictions Precautions Precautions: Fall      Mobility  Bed Mobility Overal bed mobility: Needs Assistance Bed Mobility: Supine to Sit     Supine to sit: Min guard;HOB elevated     General bed mobility comments: use of bed rail  Transfers Overall transfer level: Needs assistance Equipment used: Rolling walker (2 wheeled) Transfers: Sit to/from UGI CorporationStand;Stand Pivot Transfers Sit to Stand: Min guard Stand pivot transfers: Min guard       General transfer comment: verbal cues for safety  Ambulation/Gait Ambulation/Gait assistance: Min assist Ambulation Distance (Feet): 50 Feet Assistive device: Rolling walker (2 wheeled) Gait Pattern/deviations: Step-through pattern;Decreased stride length;Trunk flexed Gait velocity: decreased   General Gait Details: decreased safety  awareness with wheels of RW getting caught on multi objects and requiring min assist to correct.  Stairs            Wheelchair Mobility    Modified Rankin (Stroke Patients Only)       Balance Overall balance assessment: Needs assistance Sitting-balance support: No upper extremity supported Sitting balance-Leahy Scale: Fair     Standing balance support: Bilateral upper extremity supported Standing balance-Leahy Scale: Poor                               Pertinent Vitals/Pain Pain Assessment: No/denies pain    Home Living Family/patient expects to be discharged to:: Assisted living               Home Equipment: Walker - 2 wheels      Prior Function Level of Independence: Needs assistance   Gait / Transfers Assistance Needed: supervision for safety     Comments: Pt is poor historian and answers PLOF questions with "I don't know"     Hand Dominance        Extremity/Trunk Assessment   Upper Extremity Assessment: Generalized weakness           Lower Extremity Assessment: Generalized weakness         Communication   Communication: HOH  Cognition Arousal/Alertness: Awake/alert Behavior During Therapy: WFL for tasks assessed/performed Overall Cognitive Status: History of cognitive impairments - at baseline       Memory: Decreased short-term memory              General  Comments      Exercises        Assessment/Plan    PT Assessment Patient needs continued PT services  PT Diagnosis Difficulty walking;Generalized weakness   PT Problem List Decreased strength;Decreased activity tolerance;Decreased balance;Decreased mobility;Decreased knowledge of precautions  PT Treatment Interventions Gait training;Functional mobility training;Therapeutic activities;Therapeutic exercise;Patient/family education;Balance training;DME instruction   PT Goals (Current goals can be found in the Care Plan section) Acute Rehab PT Goals Patient  Stated Goal: unable to state PT Goal Formulation: Patient unable to participate in goal setting Time For Goal Achievement: 07/05/14 Potential to Achieve Goals: Fair    Frequency Min 3X/week   Barriers to discharge        Co-evaluation               End of Session Equipment Utilized During Treatment: Gait belt Activity Tolerance: Patient tolerated treatment well Patient left: in bed;with call bell/phone within reach;with bed alarm set      Functional Assessment Tool Used: clinical judgement Functional Limitation: Mobility: Walking and moving around Mobility: Walking and Moving Around Current Status [phone](G8978): At least 1 percent but less than 20 percent impaired, limited or restricted Mobility: Walking and Moving Around Goal Status [phone](G8979): At least 1 percent but less than 20 percent impaired, limited or restricted    Time: 0847-0900 PT Time Calculation (min): 13 min   Charges:   PT Evaluation $Initial PT Evaluation Tier I: 1 Procedure PT Treatments $Gait Training: 8-22 mins   PT G Codes:   Functional Assessment Tool Used: clinical judgement Functional Limitation: Mobility: Walking and moving around    Ilda FoilGarrow, Jimi Giza Rene 06/28/2014, 10:03 AM

## 2014-06-28 NOTE — Progress Notes (Signed)
[facility] PROGRESS NOTE  Sean Carlson ZOX:[id number] DOB: 05/05/1926 DOA: 06/27/2014 PCP: Katy Apo, MD  Assessment/Plan:  Principal Problem:  Lumbar compression fracture  X-ray shows more pronounced at L4 compression fracture but unclear if it is acute.  orthostasis is positive.  UA unremarkable  Pain control with tylenol and prn tramadol. avoid narcotics.  Physical therapy evaluation recommends higher level of care at assist living.  -follow outpt MRI results in am with PCP -Maintain fall precautions    Orthostasis with bradycardia  pt on multiple meds which can cause the symptoms including mementine and  Flomax. Will hold both. Ordered ted stockings. Hold bystolic given HR dropping to 81X.. Reduce dose of lisinopril  Active Problems:  CHF (congestive heart failure)  Continue aspirin, Lasix and lisinopril. Hold  Bystolic  Euvolemic. Continue home medications   Alzheimer's Dementia  Moderate to severe, continue namenda and  hold mamentine   Diet:cardiac  DVT prophylaxis: sq lovenox     Code Status: full Family Communication: spoke with son Disposition Plan: possibly to assist living higher level of care tomorrow   Consultants:  none  Procedures:  none  Antibiotics:  none  HPI/Subjective: No overnight issues. Patient high fall risk and orthostatic BP pos today  Objective: Filed Vitals:   06/28/14 0550  BP: 117/59  Pulse: 80  Temp: 98.6 F (37 C)  Resp: 18    Intake/Output Summary (Last 24 hours) at 06/28/14 1403 Last data filed at 06/28/14 1312  Gross per 24 hour  Intake    600 ml  Output    150 ml  Net    450 ml   Filed Weights   06/28/14 1300  Weight: 72.576 kg (160 lb)    Exam:   General:  NAD, demented  HEENT: moist mucosa  Chest: clear b/l   CVS: NS1&S2, no murmurs  Abd: soft, NT, ND, BS+  Ext: warm, no edema, no back tenderness  CNS: oriented to self only    Data Reviewed: Basic Metabolic  Panel:  Recent Labs Lab 06/27/14 0925  NA 138  K 4.5  CL 101  CO2 22  GLUCOSE 123*  BUN 48*  CREATININE 1.26  CALCIUM 9.1   Liver Function Tests:  Recent Labs Lab 06/27/14 0925  AST 47*  ALT 41  ALKPHOS 203*  BILITOT 1.2  PROT 6.7  ALBUMIN 3.4*   No results found for this basename: LIPASE, AMYLASE,  in the last 168 hours No results found for this basename: AMMONIA,  in the last 168 hours CBC:  Recent Labs Lab 06/27/14 0925  WBC 13.0*  NEUTROABS 8.3*  HGB 15.0  HCT 42.0  MCV 97.4  PLT 246   Cardiac Enzymes: No results found for this basename: CKTOTAL, CKMB, CKMBINDEX, TROPONINI,  in the last 168 hours BNP (last 3 results)  Recent Labs  03/21/14 0430 03/22/14 0315 03/23/14 0230  PROBNP 2719.0* 2567.0* 2646.0*   CBG: No results found for this basename: GLUCAP,  in the last 168 hours  No results found for this or any previous visit (from the past 240 hour(s)).   Studies: Dg Ribs Unilateral W/chest Right  06/27/2014   CLINICAL DATA:  Fall at nursing home. No loss of consciousness. Fall from the same level. The patient could not hit anything while falling. Right-sided rib pain. Low back pain.  EXAM: RIGHT RIBS AND CHEST - 3+ VIEW  COMPARISON:  One-view chest 03/21/2014.  FINDINGS: The heart size is exaggerated by low lung volumes.  Lungs are  clear.  Dedicated imaging of the ribs demonstrates callus formation about a lateral right ninth rib fracture. There is no pneumothorax. No acute fractures are evident.  IMPRESSION: 1. Healing are healed right lateral ninth rib fracture. 2. No acute fracture. 3. No acute cardiopulmonary disease.   Electronically Signed   By: Gennette Pachris  Mattern M.D.   On: 06/27/2014 11:04   Dg Lumbar Spine Complete  06/27/2014   CLINICAL DATA:  Fall with low back pain. Recent L4 compression fracture.  EXAM: LUMBAR SPINE - COMPLETE 4+ VIEW  COMPARISON:  06/16/2014  FINDINGS: Degree of compression of the central L4 vertebral body appears slightly  more prominent compared to the prior study. However, the total amount of vertebral body compression still is likely just under 50% loss of height. The mild compression of the L1 vertebral body appears stable and is likely an old compression fracture. No new fracture identified. No evidence of subluxation.  IMPRESSION: Mildly more prominent appearance to compression of the L4 vertebral body. Overall loss of height still remains at or just below 50%.   Electronically Signed   By: Irish LackGlenn  Yamagata M.D.   On: 06/27/2014 11:03   Dg Pelvis 1-2 Views  06/27/2014   CLINICAL DATA:  Pain post trauma  EXAM: PELVIS - 1-2 VIEW  COMPARISON:  Lumbar spine series June 16, 2014  FINDINGS: There is no evidence of pelvic fracture or dislocation. There is extensive osteoarthritic change in the right hip joint. There is moderate osteoarthritic change in the left hip joint. There is periarticular osteoporosis. There are foci of vascular calcification bilaterally. There is stable compression of the L4 vertebral body.  IMPRESSION: Extensive osteoarthritic change right hip. Moderate osteoarthritic change left hip. No acute fracture or dislocation apparent. Stable wedge compression L4 vertebral body.   Electronically Signed   By: Bretta BangWilliam  Woodruff M.D.   On: 06/27/2014 10:58   Ct Head Wo Contrast  06/27/2014   CLINICAL DATA:  Fall with head injury and neck pain.  EXAM: CT HEAD WITHOUT CONTRAST  CT CERVICAL SPINE WITHOUT CONTRAST  TECHNIQUE: Multidetector CT imaging of the head and cervical spine was performed following the standard protocol without intravenous contrast. Multiplanar CT image reconstructions of the cervical spine were also generated.  COMPARISON:  CT of the head on 12/21/2010.  FINDINGS: CT HEAD FINDINGS  There is some progression of cortical atrophy and deep white matter small vessel disease since the prior CT. The brain demonstrates no evidence of hemorrhage, infarction, edema, mass effect, extra-axial fluid  collection, hydrocephalus or mass lesion. The skull is unremarkable.  CT CERVICAL SPINE FINDINGS  The cervical spine shows normal alignment. There is no evidence of acute fracture or subluxation. No soft tissue swelling or hematoma is identified. Degenerative changes present of the cervical spine, most prominently at C6-7. No bony or soft tissue lesions are seen. Heavily calcified plaque is present at both carotid bifurcations. The visualized airway is normally patent.  IMPRESSION: 1. Progression of cortical atrophy and small vessel disease of the brain. No acute findings by head CT. 2. No evidence of cervical fracture. Degenerative changes present as well as carotid atherosclerosis.   Electronically Signed   By: Irish LackGlenn  Yamagata M.D.   On: 06/27/2014 10:38   Ct Cervical Spine Wo Contrast  06/27/2014   CLINICAL DATA:  Fall with head injury and neck pain.  EXAM: CT HEAD WITHOUT CONTRAST  CT CERVICAL SPINE WITHOUT CONTRAST  TECHNIQUE: Multidetector CT imaging of the head and cervical spine was performed following  the standard protocol without intravenous contrast. Multiplanar CT image reconstructions of the cervical spine were also generated.  COMPARISON:  CT of the head on 12/21/2010.  FINDINGS: CT HEAD FINDINGS  There is some progression of cortical atrophy and deep white matter small vessel disease since the prior CT. The brain demonstrates no evidence of hemorrhage, infarction, edema, mass effect, extra-axial fluid collection, hydrocephalus or mass lesion. The skull is unremarkable.  CT CERVICAL SPINE FINDINGS  The cervical spine shows normal alignment. There is no evidence of acute fracture or subluxation. No soft tissue swelling or hematoma is identified. Degenerative changes present of the cervical spine, most prominently at C6-7. No bony or soft tissue lesions are seen. Heavily calcified plaque is present at both carotid bifurcations. The visualized airway is normally patent.  IMPRESSION: 1. Progression of  cortical atrophy and small vessel disease of the brain. No acute findings by head CT. 2. No evidence of cervical fracture. Degenerative changes present as well as carotid atherosclerosis.   Electronically Signed   By: Irish LackGlenn  Yamagata M.D.   On: 06/27/2014 10:38    Scheduled Meds: . albuterol  2.5 mg Nebulization BID  . aspirin  81 mg Oral Daily  . donepezil  10 mg Oral QHS  . dutasteride  0.5 mg Oral QHS  . enoxaparin (LOVENOX) injection  40 mg Subcutaneous Q24H  . [START ON 06/29/2014] furosemide  40 mg Oral Daily  . [START ON 06/29/2014] lisinopril  2.5 mg Oral Daily  . meloxicam  15 mg Oral Daily  . [START ON 06/29/2014] memantine  10 mg Oral Daily  . potassium chloride SA  20 mEq Oral Daily   Continuous Infusions:    Time spent: 25 minutes    Khadeeja Elden  [facility] Pager [phone] If 7PM-7AM, please contact night-coverage at [email or web address], password [email or web address] 06/28/2014, 2:03 PM  LOS: 1 day

## 2014-06-28 NOTE — Progress Notes (Signed)
Utilization Review Completed.   Amera Banos, RN, BSN Nurse Case Manager  

## 2014-06-29 ENCOUNTER — Other Ambulatory Visit: Payer: Self-pay

## 2014-06-29 DIAGNOSIS — M1990 Unspecified osteoarthritis, unspecified site: Secondary | ICD-10-CM | POA: Diagnosis not present

## 2014-06-29 DIAGNOSIS — M4856XD Collapsed vertebra, not elsewhere classified, lumbar region, subsequent encounter for fracture with routine healing: Secondary | ICD-10-CM | POA: Diagnosis not present

## 2014-06-29 DIAGNOSIS — R001 Bradycardia, unspecified: Secondary | ICD-10-CM | POA: Diagnosis present

## 2014-06-29 DIAGNOSIS — S0990XA Unspecified injury of head, initial encounter: Secondary | ICD-10-CM | POA: Diagnosis not present

## 2014-06-29 DIAGNOSIS — I10 Essential (primary) hypertension: Secondary | ICD-10-CM | POA: Diagnosis not present

## 2014-06-29 MED ORDER — LISINOPRIL 2.5 MG PO TABS: 2.5000 mg | ORAL_TABLET | Freq: Every day | ORAL | Status: AC

## 2014-06-29 MED ORDER — DUTASTERIDE 0.5 MG PO CAPS: 0.5000 mg | ORAL_CAPSULE | Freq: Every day | ORAL | Status: AC

## 2014-06-29 NOTE — Clinical Social Work Psychosocial (Signed)
Clinical Social Work Department BRIEF PSYCHOSOCIAL ASSESSMENT 06/29/2014  Patient:  Sean Carlson,Sean Carlson     Account Number:  [id number]     Admit date:  06/27/2014  Clinical Social Worker:  Mosie EpsteinVAUGHN,Bertha Lokken S, LCSWA  Date/Time:  06/29/2014 02:03 PM  Referred by:  Physician  Date Referred:  06/29/2014 Referred for  ALF Placement   Other Referral:   none.   Interview type:  Family Other interview type:   CSW spoke with pt's son, Sean Carlson [phone])    PSYCHOSOCIAL DATA Living Status:  FACILITY [facility] Level of care:  Assisted Living Primary support name:  Sean Carlson Primary support relationship to patient:  CHILD, ADULT Degree of support available:   Strong support system.    CURRENT CONCERNS Current Concerns  Post-Acute Placement   Other Concerns:   none.    SOCIAL WORK ASSESSMENT / PLAN CSW spoke with pt's son, Sean Carlson, regarding pt returning to [facility] at time of discharge. [facility] who states pt is able to return once medically stable for discharge. CSW to continue to follow and assist with discharge planning needs.   Assessment/plan status:  Psychosocial Support/Ongoing Assessment of Needs Other assessment/ plan:   none.   Information/referral to community resources:   Pt returning to [facility].    PATIENT'S/FAMILY'S RESPONSE TO PLAN OF CARE: Pt's son understanding and agreeable to CSW plan of care. Pt's son expressed no further questions or concerns at this time.       Sean Carlson, LCSWA [phone]) Licensed Clinical Social Worker Orthopedics [phone]([id number]) and Surgical [phone]([id number])

## 2014-06-29 NOTE — Clinical Social Work Note (Signed)
Pt to be discharged to [facility]. Pt's son, Larwance RoteCameron Hoeschen, updated via phone.  [facility]: [phone] Transporation: [facility] to provide transportation for pt at 3:30pm.  Marcelline DeistEmily Marybell Robards, LCSWA [phone]) Licensed Clinical Social Worker Orthopedics [phone]([id number]) and Surgical [phone]([id number])

## 2014-06-29 NOTE — Discharge Summary (Signed)
Physician Discharge Summary  Sean Carlson WNU:[id number] DOB: 12-10-1925 DOA: 06/27/2014  PCP: [name],[name], MD  Admit date: 06/27/2014 Discharge date: 06/29/2014  Time spent:25  minutes  Recommendations for Outpatient Follow-up:  1. Discharge to assist living 2. Follow up with PCP in 1 week  Discharge Diagnoses:  Principal Problem:   Lumbar compression fracture  Active Problems:   Orthostatic hypotension   Fall at home   CHF (congestive heart failure)   Dementia   Hypertension   Bradycardia   Discharge Condition: fair  Diet recommendation: cardiac  Filed Weights   06/28/14 1300  Weight: 72.576 kg (160 lb)    History of present illness:  80 year old male with history of hypertension, osteoarthritis, advanced Alzheimer's dementia, CHF and new onset A. fib resident of assist living was sent to the ED after a possible mechanical fall . As per the son who provided the history patient has had multiple falls at the assisted-living over past 2 weeks. He had 2 mechanical falls following which he saw his PCP last week and had an MRI of his lumbar spine done yesterday ( [facility], the result of which is not available) . He then had a fall at the assisted-living again today and possibly hit his low back and neck. The staff at the assisted-living found him lying on the floor. Patient was not found to have any change in mental status, bleeding, bowel or urinary incontinence. Patient has advanced dementia and is unable to provide much history.  Patient denies headache, dizziness, fever, nausea , vomiting, chest pain,SOB, abdominal pain, bowel or urinary symptoms.  In the ED patient's vitals were stable.  WBC of 13 and BUN of 48. X-ray of the lumbar spine showed compression fracture of L4 vertebral body which was more prominent appearing. Head CT, CT of the cervical spine, history of the pelvis was unremarkable for acute injury. X-ray of the chest showed healing right rib  fracture. Hospitalist admission requested for observation, pain control and physical therapy evaluation   Hospital Course:  Lumbar compression fracture  X-ray shows more pronounced at L4 compression fracture but unclear if it is acute.  orthostasis is positive.  UA unremarkable  Pain control with home dose tylenol and prn tramadol. avoid narcotics. doesnot appear to be in any distress. Physical therapy evaluation recommends higher level of care at assist living.  -d/w PCP. Patient has MRI of lumbar spine scheduled for 07/01/2014. -Maintain fall precautions at Assist living.  Orthostasis with bradycardia  pt on multiple meds which can cause the symptoms including mementine and Flomax. Discontinued both. Ordered ted stockings. Hold bystolic given HR dropping to 03K40s.. Reduce dose of lisinopril to 2.5 mg daily.  Active Problems:   Afib  this was noted since 2 months back. Seen by cardiology at that time and started on bystolic. Recommended pt not a candidate for anticoagulation or cardioversion. Son agrees with this and does not wish for any aggressive cardiac measures. continue aspirin.  CHF (congestive heart failure)  Continue aspirin, Lasix and lisinopril. Hold Bystolic  Euvolemic.   Alzheimer's Dementia  Moderate to severe, continue namenda. hold mamentine given orthostasis  Diet:cardiac    Code Status: DNR. Discussed with son in detail, reports patient having advance directives and doesnot want any aggressive measures including ventilator support, chemical or mechanical resuscitations if needed.  Family Communication: spoke with son on the phone. Discussed with PCP on the phone Disposition Plan: D/C to assist living, needs higher level of su[pervision and care  Consultants:  None  Procedures:  none Antibiotics:  none     Discharge Exam: Filed Vitals:   06/29/14 0458  BP: 133/74  Pulse: 87  Temp: 98 F (36.7 C)  Resp: 18    General: NAD, demented  HEENT:  moist mucosa  Chest: clear b/l  CVS: S1&S2 irregular, no murmurs  Abd: soft, NT, ND, BS+  Ext: warm, no edema, no back tenderness  CNS: oriented to self only   Discharge Instructions You were cared for by a hospitalist during your hospital stay. If you have any questions about your discharge medications or the care you received while you were in the hospital after you are discharged, you can call the unit and asked to speak with the hospitalist on call if the hospitalist that took care of you is not available. Once you are discharged, your primary care physician will handle any further medical issues. Please note that NO REFILLS for any discharge medications will be authorized once you are discharged, as it is imperative that you return to your primary care physician (or establish a relationship with a primary care physician if you do not have one) for your aftercare needs so that they can reassess your need for medications and monitor your lab values.   Current Discharge Medication List    START taking these medications   Details  dutasteride (AVODART) 0.5 MG capsule Take 1 capsule (0.5 mg total) by mouth at bedtime. Qty: 30 capsule, Refills: 0      CONTINUE these medications which have NOT CHANGED   Details  albuterol (PROVENTIL) (2.5 MG/3ML) 0.083% nebulizer solution Take 2.5 mg by nebulization 2 (two) times daily.    aspirin 81 MG chewable tablet Chew 81 mg by mouth daily.    donepezil (ARICEPT) 10 MG tablet Take 10 mg by mouth at bedtime.    furosemide (LASIX) 40 MG tablet Take 1 tablet (40 mg total) by mouth 2 (two) times daily. Qty: 60 tablet, Refills: 0    lisinopril (PRINIVIL,ZESTRIL) 2.5 MG tablet Take 2.5 mg by mouth daily.  ( dose changed) Qty:30 tablets, no refill    loperamide (IMODIUM) 2 MG capsule Take 1 capsule (2 mg total) by mouth 4 (four) times daily as needed for diarrhea or loose stools. Qty: 12 capsule, Refills: 0    meloxicam (MOBIC) 15 MG tablet Take 1  tablet (15 mg total) by mouth daily. Qty: 15 tablet, Refills: 0    potassium chloride SA (K-DUR,KLOR-CON) 20 MEQ tablet Take 1 tablet (20 mEq total) by mouth daily. Qty: 30 tablet, Refills: 0    traMADol (ULTRAM) 50 MG tablet Take 50 mg by mouth every 8 (eight) hours as needed (pain).     ZINC OXIDE EX Apply 1 application topically as needed (apply to affected area of buttocks).      STOP taking these medications     Dutasteride-Tamsulosin HCl (JALYN) 0.5-0.4 MG CAPS      memantine (NAMENDA) 10 MG tablet      nebivolol (BYSTOLIC) 2.5 MG tablet        No Known Allergies Follow-up Information   Follow up with POLITE,[name], MD. Schedule an appointment as soon as possible for a visit in 1 week.   Specialty:  Internal Medicine   Contact information:   [address] [place] [address] [phone]        The results of significant diagnostics from this hospitalization (including imaging, microbiology, ancillary and laboratory) are listed below for reference.  Significant Diagnostic Studies: Dg Ribs Unilateral W/chest Right  06/27/2014   CLINICAL DATA:  Fall at nursing home. No loss of consciousness. Fall from the same level. The patient could not hit anything while falling. Right-sided rib pain. Low back pain.  EXAM: RIGHT RIBS AND CHEST - 3+ VIEW  COMPARISON:  One-view chest 03/21/2014.  FINDINGS: The heart size is exaggerated by low lung volumes.  Lungs are clear.  Dedicated imaging of the ribs demonstrates callus formation about a lateral right ninth rib fracture. There is no pneumothorax. No acute fractures are evident.  IMPRESSION: 1. Healing are healed right lateral ninth rib fracture. 2. No acute fracture. 3. No acute cardiopulmonary disease.   Electronically Signed   By: Gennette Pachris  Mattern M.D.   On: 06/27/2014 11:04   Dg Lumbar Spine Complete  06/27/2014   CLINICAL DATA:  Fall with low back pain. Recent L4 compression fracture.  EXAM: LUMBAR SPINE -  COMPLETE 4+ VIEW  COMPARISON:  06/16/2014  FINDINGS: Degree of compression of the central L4 vertebral body appears slightly more prominent compared to the prior study. However, the total amount of vertebral body compression still is likely just under 50% loss of height. The mild compression of the L1 vertebral body appears stable and is likely an old compression fracture. No new fracture identified. No evidence of subluxation.  IMPRESSION: Mildly more prominent appearance to compression of the L4 vertebral body. Overall loss of height still remains at or just below 50%.   Electronically Signed   By: Irish LackGlenn  Yamagata M.D.   On: 06/27/2014 11:03   Dg Lumbar Spine Complete  06/16/2014   CLINICAL DATA:  80 year old male post fall. Pain. Initial encounter.  EXAM: LUMBAR SPINE - COMPLETE 4+ VIEW  COMPARISON:  None.  FINDINGS: L4 vertebral body compression fracture with mild retropulsion posterior superior aspect. 40% loss of height centrally.  L1 superior endplate Schmorl's node deformity/compression fracture of questionable age.  Minimal Schmorl's node deformity L1-2 and L2-3 level.  Prominent degenerative changes L5-S1.  Vascular calcifications.  IMPRESSION: L4 vertebral body compression fracture with mild retropulsion posterior superior aspect. 40% loss of height centrally.  L1 superior endplate Schmorl's node deformity/compression fracture of questionable age   Electronically Signed   By: Bridgett LarssonSteve  Olson M.D.   On: 06/16/2014 11:47   Dg Pelvis 1-2 Views  06/27/2014   CLINICAL DATA:  Pain post trauma  EXAM: PELVIS - 1-2 VIEW  COMPARISON:  Lumbar spine series June 16, 2014  FINDINGS: There is no evidence of pelvic fracture or dislocation. There is extensive osteoarthritic change in the right hip joint. There is moderate osteoarthritic change in the left hip joint. There is periarticular osteoporosis. There are foci of vascular calcification bilaterally. There is stable compression of the L4 vertebral body.   IMPRESSION: Extensive osteoarthritic change right hip. Moderate osteoarthritic change left hip. No acute fracture or dislocation apparent. Stable wedge compression L4 vertebral body.   Electronically Signed   By: Bretta BangWilliam  Woodruff M.D.   On: 06/27/2014 10:58   Ct Head Wo Contrast  06/27/2014   CLINICAL DATA:  Fall with head injury and neck pain.  EXAM: CT HEAD WITHOUT CONTRAST  CT CERVICAL SPINE WITHOUT CONTRAST  TECHNIQUE: Multidetector CT imaging of the head and cervical spine was performed following the standard protocol without intravenous contrast. Multiplanar CT image reconstructions of the cervical spine were also generated.  COMPARISON:  CT of the head on 12/21/2010.  FINDINGS: CT HEAD FINDINGS  There is some progression of cortical  atrophy and deep white matter small vessel disease since the prior CT. The brain demonstrates no evidence of hemorrhage, infarction, edema, mass effect, extra-axial fluid collection, hydrocephalus or mass lesion. The skull is unremarkable.  CT CERVICAL SPINE FINDINGS  The cervical spine shows normal alignment. There is no evidence of acute fracture or subluxation. No soft tissue swelling or hematoma is identified. Degenerative changes present of the cervical spine, most prominently at C6-7. No bony or soft tissue lesions are seen. Heavily calcified plaque is present at both carotid bifurcations. The visualized airway is normally patent.  IMPRESSION: 1. Progression of cortical atrophy and small vessel disease of the brain. No acute findings by head CT. 2. No evidence of cervical fracture. Degenerative changes present as well as carotid atherosclerosis.   Electronically Signed   By: Irish Lack M.D.   On: 06/27/2014 10:38   Ct Cervical Spine Wo Contrast  06/27/2014   CLINICAL DATA:  Fall with head injury and neck pain.  EXAM: CT HEAD WITHOUT CONTRAST  CT CERVICAL SPINE WITHOUT CONTRAST  TECHNIQUE: Multidetector CT imaging of the head and cervical spine was performed  following the standard protocol without intravenous contrast. Multiplanar CT image reconstructions of the cervical spine were also generated.  COMPARISON:  CT of the head on 12/21/2010.  FINDINGS: CT HEAD FINDINGS  There is some progression of cortical atrophy and deep white matter small vessel disease since the prior CT. The brain demonstrates no evidence of hemorrhage, infarction, edema, mass effect, extra-axial fluid collection, hydrocephalus or mass lesion. The skull is unremarkable.  CT CERVICAL SPINE FINDINGS  The cervical spine shows normal alignment. There is no evidence of acute fracture or subluxation. No soft tissue swelling or hematoma is identified. Degenerative changes present of the cervical spine, most prominently at C6-7. No bony or soft tissue lesions are seen. Heavily calcified plaque is present at both carotid bifurcations. The visualized airway is normally patent.  IMPRESSION: 1. Progression of cortical atrophy and small vessel disease of the brain. No acute findings by head CT. 2. No evidence of cervical fracture. Degenerative changes present as well as carotid atherosclerosis.   Electronically Signed   By: Irish Lack M.D.   On: 06/27/2014 10:38   Dg Knee Complete 4 Views Right  06/16/2014   CLINICAL DATA:  Fall, pain  EXAM: RIGHT KNEE - COMPLETE 4+ VIEW  COMPARISON:  None.  FINDINGS: Previous right knee arthroplasty changes. Bones are osteopenic. No definite malalignment, acute osseous finding, displaced fracture, or effusion. Peripheral atherosclerosis noted.  IMPRESSION: Previous right knee arthroplasty.  Osteopenia  Peripheral atherosclerosis  No acute finding by plain radiography   Electronically Signed   By: Ruel Favors M.D.   On: 06/16/2014 11:47    Microbiology: No results found for this or any previous visit (from the past 240 hour(s)).   Labs: Basic Metabolic Panel:  Recent Labs Lab 06/27/14 0925  NA 138  K 4.5  CL 101  CO2 22  GLUCOSE 123*  BUN 48*   CREATININE 1.26  CALCIUM 9.1   Liver Function Tests:  Recent Labs Lab 06/27/14 0925  AST 47*  ALT 41  ALKPHOS 203*  BILITOT 1.2  PROT 6.7  ALBUMIN 3.4*   No results found for this basename: LIPASE, AMYLASE,  in the last 168 hours No results found for this basename: AMMONIA,  in the last 168 hours CBC:  Recent Labs Lab 06/27/14 0925  WBC 13.0*  NEUTROABS 8.3*  HGB 15.0  HCT 42.0  MCV  97.4  PLT 246   Cardiac Enzymes: No results found for this basename: CKTOTAL, CKMB, CKMBINDEX, TROPONINI,  in the last 168 hours BNP: BNP (last 3 results)  Recent Labs  03/21/14 0430 03/22/14 0315 03/23/14 0230  PROBNP 2719.0* 2567.0* 2646.0*   CBG: No results found for this basename: GLUCAP,  in the last 168 hours     Signed:  Bryston Colocho  [facility] 06/29/2014, 12:40 PM

## 2014-06-29 NOTE — Care Management Note (Signed)
CARE MANAGEMENT NOTE 06/29/2014  Patient:  ,Sean Carlson   Account Number:  [id number]  Date Initiated:  06/29/2014  Documentation initiated by:  Vance PeperBRADY,Donita Newland  Subjective/Objective Assessment:   80 yr old male admitted with Lumbar compression fracture, s/p fall. [facility].     Action/Plan:   Patient will need shortterm rehab at Facility. [facility].   Anticipated DC Date:  06/30/2014   Anticipated DC Plan:  SKILLED NURSING FACILITY  In-house referral  Clinical Social Worker      DC Planning Services  CM consult      [facility]  NA   Choice offered to / List presented to:     DME arranged  NA        HH arranged  NA      Status of service:  Completed, signed off Medicare Important Message given?   (If response is "NO", the following Medicare IM given date fields will be blank) Date Medicare IM given:   Medicare IM given by:   Date Additional Medicare IM given:   Additional Medicare IM given by:    Discharge Disposition:  SKILLED NURSING FACILITY  Per UR Regulation:  Reviewed for med. necessity/level of care/duration of stay  If discussed at Long Length of Stay Meetings, dates discussed:    Comments:

## 2014-07-01 ENCOUNTER — Ambulatory Visit
Admission: RE | Admit: 2014-07-01 | Discharge: 2014-07-01 | Disposition: A | Discharge status: Home / Self Care | Payer: Medicare Other | Source: Ambulatory Visit | Attending: Internal Medicine | Admitting: Internal Medicine

## 2014-07-01 DIAGNOSIS — S32050A Wedge compression fracture of fifth lumbar vertebra, initial encounter for closed fracture: Secondary | ICD-10-CM

## 2014-09-26 ENCOUNTER — Emergency Department (HOSPITAL_COMMUNITY): Payer: Medicare Other

## 2014-09-26 ENCOUNTER — Encounter (HOSPITAL_COMMUNITY): Payer: Self-pay | Admitting: Emergency Medicine

## 2014-09-26 ENCOUNTER — Emergency Department (HOSPITAL_COMMUNITY)
Admission: EM | Admit: 2014-09-26 | Discharge: 2014-09-26 | Disposition: A | Discharge status: Home / Self Care | Payer: Medicare Other | Attending: Emergency Medicine | Admitting: Emergency Medicine

## 2014-09-26 DIAGNOSIS — F0390 Unspecified dementia without behavioral disturbance: Secondary | ICD-10-CM | POA: Insufficient documentation

## 2014-09-26 DIAGNOSIS — Z7982 Long term (current) use of aspirin: Secondary | ICD-10-CM | POA: Insufficient documentation

## 2014-09-26 DIAGNOSIS — M1990 Unspecified osteoarthritis, unspecified site: Secondary | ICD-10-CM | POA: Insufficient documentation

## 2014-09-26 DIAGNOSIS — I10 Essential (primary) hypertension: Secondary | ICD-10-CM | POA: Insufficient documentation

## 2014-09-26 DIAGNOSIS — M545 Low back pain: Secondary | ICD-10-CM | POA: Diagnosis not present

## 2014-09-26 DIAGNOSIS — R51 Headache: Secondary | ICD-10-CM | POA: Diagnosis present

## 2014-09-26 DIAGNOSIS — Z791 Long term (current) use of non-steroidal anti-inflammatories (NSAID): Secondary | ICD-10-CM | POA: Insufficient documentation

## 2014-09-26 DIAGNOSIS — Z79899 Other long term (current) drug therapy: Secondary | ICD-10-CM | POA: Insufficient documentation

## 2014-09-26 DIAGNOSIS — M542 Cervicalgia: Secondary | ICD-10-CM | POA: Insufficient documentation

## 2014-09-26 DIAGNOSIS — M549 Dorsalgia, unspecified: Secondary | ICD-10-CM

## 2014-09-26 DIAGNOSIS — R519 Headache, unspecified: Secondary | ICD-10-CM

## 2014-09-26 LAB — CBC WITH DIFFERENTIAL/PLATELET
Basophils Absolute: 0 10*3/uL (ref 0.0–0.1)
Basophils Relative: 0 % (ref 0–1)
EOS ABS: 0.1 10*3/uL (ref 0.0–0.7)
EOS PCT: 1 % (ref 0–5)
HCT: 50.2 % (ref 39.0–52.0)
HEMOGLOBIN: 17.1 g/dL — AB (ref 13.0–17.0)
LYMPHS ABS: 3.4 10*3/uL (ref 0.7–4.0)
Lymphocytes Relative: 28 % (ref 12–46)
MCH: 34.5 pg — ABNORMAL HIGH (ref 26.0–34.0)
MCHC: 34.1 g/dL (ref 30.0–36.0)
MCV: 101.2 fL — ABNORMAL HIGH (ref 78.0–100.0)
MONO ABS: 1 10*3/uL (ref 0.1–1.0)
MONOS PCT: 8 % (ref 3–12)
NEUTROS ABS: 7.5 10*3/uL (ref 1.7–7.7)
Neutrophils Relative %: 63 % (ref 43–77)
Platelets: 219 10*3/uL (ref 150–400)
RBC: 4.96 MIL/uL (ref 4.22–5.81)
RDW: 12.3 % (ref 11.5–15.5)
WBC: 12.1 10*3/uL — ABNORMAL HIGH (ref 4.0–10.5)

## 2014-09-26 LAB — BASIC METABOLIC PANEL
Anion gap: 7 (ref 5–15)
BUN: 21 mg/dL (ref 6–23)
CO2: 27 mmol/L (ref 19–32)
CREATININE: 1.09 mg/dL (ref 0.50–1.35)
Calcium: 8.9 mg/dL (ref 8.4–10.5)
Chloride: 102 mEq/L (ref 96–112)
GFR calc Af Amer: 69 mL/min — ABNORMAL LOW (ref >90)
GFR calc non Af Amer: 59 mL/min — ABNORMAL LOW (ref >90)
Glucose, Bld: 172 mg/dL — ABNORMAL HIGH (ref 70–99)
Potassium: 3.8 mmol/L (ref 3.5–5.1)
Sodium: 136 mmol/L (ref 135–145)

## 2014-09-26 MED ORDER — ALBUTEROL SULFATE (2.5 MG/3ML) 0.083% IN NEBU
5.0000 mg | INHALATION_SOLUTION | Freq: Once | RESPIRATORY_TRACT | Status: AC
  Administered 2014-09-26: 5 mg via RESPIRATORY_TRACT
  Filled 2014-09-26: qty 6

## 2014-09-26 MED ORDER — HYDROCODONE-ACETAMINOPHEN 5-325 MG PO TABS
1.0000 | ORAL_TABLET | Freq: Once | ORAL | Status: AC
  Administered 2014-09-26: 1 via ORAL
  Filled 2014-09-26: qty 1

## 2014-09-26 MED ORDER — HYDROCODONE-ACETAMINOPHEN 5-325 MG PO TABS: 1.0000 | ORAL_TABLET | ORAL | Status: DC | PRN

## 2014-09-26 NOTE — ED Notes (Signed)
[facility]. Pt reports new pain in back of head.  EMS reports small bump posterior midline.  Pt and staff deny fall, pt states he doesn't remember.  Pt wheelchair bound, some baseline dementia.  Pt called out in pain when moved from stretcher to bed, stated back pain.

## 2014-09-26 NOTE — ED Provider Notes (Signed)
CSN: [id number]     Arrival date & time 09/26/14  1043 History   First MD Initiated Contact with Patient 09/26/14 1044     Chief Complaint  Patient presents with  . Back Pain  . Headache     (Consider location/radiation/quality/duration/timing/severity/associated sxs/prior Treatment) The history is provided by the patient and medical records.    LEVEL 5 CAVEAT:  DEMENTIA This is an 81 y.o. M with PMH significant for HTN, OA, dementia, presenting to the ED for headache and back pain.  Patient states he woke up with headache this morning.  Headache localized to posterior scalp.  No hx of migraine headaches.  States he gets the occasional headache, this one in particular is "not that bad".  No known injuries, trauma, or falls.  Patient not currently on any type of anti-coagulation.  Back pain has been ongoing, worse with movement.  No radiation of pain into extremities.  No numbness, paresthesias, or weakness of extremities.  No loss of bowel or bladder control.  No prior back surgeries.  Past Medical History  Diagnosis Date  . Hypertension   . Dementia   . Osteoarthritis    Past Surgical History  Procedure Laterality Date  . Appendectomy     No family history on file. History  Substance Use Topics  . Smoking status: Never Smoker   . Smokeless tobacco: Not on file  . Alcohol Use: Yes     Comment: occasionally    Review of Systems  Unable to perform ROS: Dementia      Allergies  Review of patient's allergies indicates no known allergies.  Home Medications   Prior to Admission medications   Medication Sig Start Date End Date Taking? Authorizing Provider  albuterol (PROVENTIL) (2.5 MG/3ML) 0.083% nebulizer solution Take 2.5 mg by nebulization 2 (two) times daily.    Historical Provider, MD  aspirin 81 MG chewable tablet Chew 81 mg by mouth daily.    Historical Provider, MD  donepezil (ARICEPT) 10 MG tablet Take 10 mg by mouth at bedtime.    Historical Provider, MD   dutasteride (AVODART) 0.5 MG capsule Take 1 capsule (0.5 mg total) by mouth at bedtime. 06/29/14   Nishant Dhungel, MD  furosemide (LASIX) 40 MG tablet Take 1 tablet (40 mg total) by mouth 2 (two) times daily. 03/24/14   Katy Apo, MD  lisinopril (PRINIVIL,ZESTRIL) 2.5 MG tablet Take 1 tablet (2.5 mg total) by mouth daily. 06/29/14   Nishant Dhungel, MD  loperamide (IMODIUM) 2 MG capsule Take 1 capsule (2 mg total) by mouth 4 (four) times daily as needed for diarrhea or loose stools. 01/10/14   Gerhard Munch, MD  meloxicam (MOBIC) 15 MG tablet Take 1 tablet (15 mg total) by mouth daily. 06/16/14   Gerhard Munch, MD  potassium chloride SA (K-DUR,KLOR-CON) 20 MEQ tablet Take 1 tablet (20 mEq total) by mouth daily. 03/24/14   Katy Apo, MD  traMADol (ULTRAM) 50 MG tablet Take 50 mg by mouth every 8 (eight) hours as needed (pain).     Historical Provider, MD  ZINC OXIDE EX Apply 1 application topically as needed (apply to affected area of buttocks).    Historical Provider, MD   BP 148/75 mmHg  Pulse 91  Temp(Src) 97.6 F (36.4 C) (Oral)  Resp 18  SpO2 100%   Physical Exam  Constitutional: He is oriented to person, place, and time. He appears well-developed and well-nourished.  Non-toxic appearance. No distress.  HENT:  Head: Normocephalic and atraumatic.  Mouth/Throat: Oropharynx is clear and moist.  Posterior knot at base of skull; non-tender, immobile, no fluctuance; no surrounding erythema or induration; no temporal tenderness  Eyes: Conjunctivae and EOM are normal. Pupils are equal, round, and reactive to light.  Neck: Normal range of motion. Neck supple.  Cardiovascular: Normal rate, regular rhythm and normal heart sounds.   Pulmonary/Chest: Effort normal and breath sounds normal. No respiratory distress. He has no wheezes.  Abdominal: Soft. Bowel sounds are normal. He exhibits no distension. There is no guarding.  Musculoskeletal: Normal range of motion.       Cervical back:  He exhibits tenderness, bony tenderness and pain.       Lumbar back: He exhibits tenderness, bony tenderness and pain.  Neurological: He is alert and oriented to person, place, and time.  AAOx3, answering questions appropriately; equal strength UE and LE bilaterally; CN grossly intact; moves all extremities appropriately without ataxia; no focal neuro deficits or facial asymmetry appreciated  Skin: Skin is warm and dry. He is not diaphoretic.  Psychiatric: He has a normal mood and affect.  Nursing note and vitals reviewed.   ED Course  Procedures (including critical care time) Labs Review Labs Reviewed  CBC WITH DIFFERENTIAL - Abnormal; Notable for the following:    WBC 12.1 (*)    Hemoglobin 17.1 (*)    MCV 101.2 (*)    MCH 34.5 (*)    All other components within normal limits  BASIC METABOLIC PANEL - Abnormal; Notable for the following:    Glucose, Bld 172 (*)    GFR calc non Af Amer 59 (*)    GFR calc Af Amer 69 (*)    All other components within normal limits    Imaging Review Dg Lumbar Spine Complete  09/26/2014   CLINICAL DATA:  Back pain, progressive  EXAM: LUMBAR SPINE - COMPLETE 4+ VIEW  COMPARISON:  Lumbar MRI July 01, 2014  FINDINGS: Frontal, lateral, spot lumbosacral lateral, and bilateral oblique views were obtained. The there are 5 non-rib-bearing lumbar type vertebral bodies. There is fairly marked compression of the L4 vertebral body with disruption of the inferior endplate at L4, stable. Moderate wedging at T12 and L1 are likewise stable. No new fracture. No spondylolisthesis. Moderately severe disc space narrowing at L5-S1 is stable. There is moderate narrowing at T12-L1 and L1-2, stable. There is facet osteoarthritic change at all levels bilaterally, most severe at L4-5 and L5-S1 bilaterally.  IMPRESSION: Stable compression fractures compared to prior MR examination. No new fracture. No spondylolisthesis. Multilevel osteoarthritic change.   Electronically Signed   By:  Bretta Bang M.D.   On: 09/26/2014 11:51   Ct Head Wo Contrast  09/26/2014   CLINICAL DATA:  81 year old male with new onset of low back pain and pain in the back of the head since earlier this morning. Dementia.  EXAM: CT HEAD WITHOUT CONTRAST  CT CERVICAL SPINE WITHOUT CONTRAST  TECHNIQUE: Multidetector CT imaging of the head and cervical spine was performed following the standard protocol without intravenous contrast. Multiplanar CT image reconstructions of the cervical spine were also generated.  COMPARISON:  Head CT 06/27/2014.  C-spine CT 06/27/2014.  FINDINGS: CT HEAD FINDINGS  Moderate cerebral and mild cerebellar atrophy. Physiologic calcifications in the basal ganglia bilaterally. Patchy and confluent areas of decreased attenuation are noted throughout the deep and periventricular white matter of the cerebral hemispheres bilaterally, compatible with chronic microvascular ischemic disease. No acute intracranial abnormalities. Specifically, no evidence of acute intracranial hemorrhage, no definite  findings of acute/subacute cerebral ischemia, no mass, mass effect, hydrocephalus or abnormal intra or extra-axial fluid collections. Visualized paranasal sinuses and mastoids are well pneumatized. No acute displaced skull fractures are identified.  CT CERVICAL SPINE FINDINGS  No acute displaced fractures of the cervical spine. Alignment is anatomic. Prevertebral soft tissues are normal. Severe multilevel degenerative disc disease, most pronounced at C6-C7. Severe multilevel facet arthropathy. Visualized portions of the upper thorax are unremarkable.  IMPRESSION: 1. No evidence of significant acute traumatic injury to the skull, brain or cervical spine. 2. Moderate cerebral and mild cerebellar atrophy with extensive chronic microvascular ischemic changes in the cerebral white matter, similar to prior examinations. 3. Multilevel degenerative disc disease and cervical spondylosis redemonstrated, as above.    Electronically Signed   By: Trudie Reedaniel  Entrikin M.D.   On: 09/26/2014 11:46   Ct Cervical Spine Wo Contrast  09/26/2014   CLINICAL DATA:  81 year old male with new onset of low back pain and pain in the back of the head since earlier this morning. Dementia.  EXAM: CT HEAD WITHOUT CONTRAST  CT CERVICAL SPINE WITHOUT CONTRAST  TECHNIQUE: Multidetector CT imaging of the head and cervical spine was performed following the standard protocol without intravenous contrast. Multiplanar CT image reconstructions of the cervical spine were also generated.  COMPARISON:  Head CT 06/27/2014.  C-spine CT 06/27/2014.  FINDINGS: CT HEAD FINDINGS  Moderate cerebral and mild cerebellar atrophy. Physiologic calcifications in the basal ganglia bilaterally. Patchy and confluent areas of decreased attenuation are noted throughout the deep and periventricular white matter of the cerebral hemispheres bilaterally, compatible with chronic microvascular ischemic disease. No acute intracranial abnormalities. Specifically, no evidence of acute intracranial hemorrhage, no definite findings of acute/subacute cerebral ischemia, no mass, mass effect, hydrocephalus or abnormal intra or extra-axial fluid collections. Visualized paranasal sinuses and mastoids are well pneumatized. No acute displaced skull fractures are identified.  CT CERVICAL SPINE FINDINGS  No acute displaced fractures of the cervical spine. Alignment is anatomic. Prevertebral soft tissues are normal. Severe multilevel degenerative disc disease, most pronounced at C6-C7. Severe multilevel facet arthropathy. Visualized portions of the upper thorax are unremarkable.  IMPRESSION: 1. No evidence of significant acute traumatic injury to the skull, brain or cervical spine. 2. Moderate cerebral and mild cerebellar atrophy with extensive chronic microvascular ischemic changes in the cerebral white matter, similar to prior examinations. 3. Multilevel degenerative disc disease and cervical  spondylosis redemonstrated, as above.   Electronically Signed   By: Trudie Reedaniel  Entrikin M.D.   On: 09/26/2014 11:46     EKG Interpretation None      MDM   Final diagnoses:  Back pain  Headache, unspecified headache type    81 -year-old male with headache and back pain. No known injury, trauma, or falls.   Neurologic exam is non-focal.  Patient not currently on anti-coagulation.  No temporal tenderness to suggest temporal arteritis.  CT head/cervical spine and  Plain films of lumbar spine ordered. Basic labs pending.   12:42 PM  CT head and cervical spine negative for acute findings. Lumbar films with unchanged known compression fractures.  Staff from assisted living facility have arrived at bedside in place of patient's son.  States his prescription for his daily norco has lapsed, did not receive his morning pain medications-- this is likely the source of his increased pain.  Also state it is time for his scheduled albuterol treatment.  Patient given dose of his daily norco and albuterol treatment, currently resting comfortably in bed.  No  further complaints at this time.  VS have remained stable in ED, feel patient appropriate for discharge.  Short refill of norco written, recommended FU closely with PCP.  Discussed plan with patient, he/she acknowledged understanding and agreed with plan of care.  Return precautions given for new or worsening symptoms.  Case discussed with attending physician, Dr. Littie Deeds, who evaluated patient and agrees with assessment and plan of care.  Garlon Hatchet, PA-C 09/26/14 1451  Mirian Mo, MD 09/29/14 1444

## 2014-09-26 NOTE — Discharge Instructions (Signed)
Take the prescribed medication as directed. °Follow-up with your primary care physician. °Return to the ED for new or worsening symptoms. ° °

## 2014-10-31 ENCOUNTER — Emergency Department (HOSPITAL_COMMUNITY)
Admission: EM | Admit: 2014-10-31 | Discharge: 2014-11-01 | Disposition: A | Discharge status: Home / Self Care | Payer: Medicare Other | Attending: Emergency Medicine | Admitting: Emergency Medicine

## 2014-10-31 ENCOUNTER — Emergency Department (HOSPITAL_COMMUNITY): Payer: Medicare Other

## 2014-10-31 DIAGNOSIS — S4992XA Unspecified injury of left shoulder and upper arm, initial encounter: Secondary | ICD-10-CM | POA: Diagnosis not present

## 2014-10-31 DIAGNOSIS — Z7982 Long term (current) use of aspirin: Secondary | ICD-10-CM | POA: Insufficient documentation

## 2014-10-31 DIAGNOSIS — M545 Low back pain, unspecified: Secondary | ICD-10-CM

## 2014-10-31 DIAGNOSIS — Y9389 Activity, other specified: Secondary | ICD-10-CM | POA: Diagnosis not present

## 2014-10-31 DIAGNOSIS — I10 Essential (primary) hypertension: Secondary | ICD-10-CM | POA: Diagnosis not present

## 2014-10-31 DIAGNOSIS — M1990 Unspecified osteoarthritis, unspecified site: Secondary | ICD-10-CM | POA: Insufficient documentation

## 2014-10-31 DIAGNOSIS — F0390 Unspecified dementia without behavioral disturbance: Secondary | ICD-10-CM | POA: Diagnosis not present

## 2014-10-31 DIAGNOSIS — W01198A Fall on same level from slipping, tripping and stumbling with subsequent striking against other object, initial encounter: Secondary | ICD-10-CM | POA: Insufficient documentation

## 2014-10-31 DIAGNOSIS — Z79899 Other long term (current) drug therapy: Secondary | ICD-10-CM | POA: Diagnosis not present

## 2014-10-31 DIAGNOSIS — W19XXXA Unspecified fall, initial encounter: Secondary | ICD-10-CM

## 2014-10-31 DIAGNOSIS — Y998 Other external cause status: Secondary | ICD-10-CM | POA: Insufficient documentation

## 2014-10-31 DIAGNOSIS — Y92129 Unspecified place in nursing home as the place of occurrence of the external cause: Secondary | ICD-10-CM

## 2014-10-31 DIAGNOSIS — Y92122 Bedroom in nursing home as the place of occurrence of the external cause: Secondary | ICD-10-CM | POA: Diagnosis not present

## 2014-10-31 DIAGNOSIS — S199XXA Unspecified injury of neck, initial encounter: Secondary | ICD-10-CM | POA: Diagnosis not present

## 2014-10-31 DIAGNOSIS — S3992XA Unspecified injury of lower back, initial encounter: Secondary | ICD-10-CM | POA: Diagnosis not present

## 2014-10-31 DIAGNOSIS — M542 Cervicalgia: Secondary | ICD-10-CM

## 2014-10-31 DIAGNOSIS — M25512 Pain in left shoulder: Secondary | ICD-10-CM

## 2014-10-31 NOTE — ED Notes (Addendum)
EKG given to EDP I Knapp for review 

## 2014-10-31 NOTE — ED Notes (Signed)
[facility] comes [facility] [place].

## 2014-10-31 NOTE — ED Notes (Signed)
Bed: WA06 Expected date: 10/31/14 Expected time: 11:01 PM Means of arrival: Ambulance Comments: Fall

## 2014-11-01 ENCOUNTER — Emergency Department (HOSPITAL_COMMUNITY): Payer: Medicare Other

## 2014-11-01 MED ORDER — HYDROCODONE-ACETAMINOPHEN 5-325 MG PO TABS
1.0000 | ORAL_TABLET | Freq: Once | ORAL | Status: AC
  Administered 2014-11-01: 1 via ORAL
  Filled 2014-11-01: qty 1

## 2014-11-01 NOTE — ED Provider Notes (Signed)
CSN: [id number]     Arrival date & time 10/31/14  2322 History   First MD Initiated Contact with Patient 10/31/14 2330     Chief Complaint  Patient presents with  . Fall   Level V caveat for dementia  (Consider location/radiation/quality/duration/timing/severity/associated sxs/prior Treatment) HPI  Patient presented to the emergency department tonight after he slipped off the toilet. He has been complaining of pain in his neck and his back and his left arm. Patient does not recall what happened. He complains of pain in his left arm but states "it's not as painful" as it has been.   PCP Dr. Nehemiah Settle  Past Medical History  Diagnosis Date  . Hypertension   . Dementia   . Osteoarthritis    Past Surgical History  Procedure Laterality Date  . Appendectomy     No family history on file. History  Substance Use Topics  . Smoking status: Never Smoker   . Smokeless tobacco: Not on file  . Alcohol Use: Yes     Comment: occasionally   patient lives in assisted living facility  Review of Systems  Unable to perform ROS: Dementia      Allergies  Review of patient's allergies indicates no known allergies.  Home Medications   Prior to Admission medications   Medication Sig Start Date End Date Taking? Authorizing Provider  albuterol (PROVENTIL) (2.5 MG/3ML) 0.083% nebulizer solution Take 2.5 mg by nebulization 2 (two) times daily.   Yes Historical Provider, MD  aspirin 81 MG chewable tablet Chew 81 mg by mouth daily.   Yes Historical Provider, MD  donepezil (ARICEPT) 10 MG tablet Take 10 mg by mouth at bedtime.   Yes Historical Provider, MD  dutasteride (AVODART) 0.5 MG capsule Take 1 capsule (0.5 mg total) by mouth at bedtime. 06/29/14  Yes Nishant Dhungel, MD  feeding supplement (ENSURE IMMUNE HEALTH) LIQD Take 237 mLs by mouth 2 (two) times daily.   Yes Historical Provider, MD  furosemide (LASIX) 40 MG tablet Take 1 tablet (40 mg total) by mouth 2 (two) times daily. 03/24/14  Yes  Katy Apo, MD  HYDROcodone-acetaminophen (NORCO/VICODIN) 5-325 MG per tablet Take 1 tablet by mouth every 4 (four) hours as needed. Patient taking differently: Take 1 tablet by mouth 2 (two) times daily.  09/26/14  Yes Garlon Hatchet, PA-C  polyethylene glycol [facility] / GLYCOLAX) packet Take 17 g by mouth daily.   Yes Historical Provider, MD  potassium chloride SA (K-DUR,KLOR-CON) 20 MEQ tablet Take 1 tablet (20 mEq total) by mouth daily. 03/24/14  Yes Katy Apo, MD  ZINC OXIDE EX Apply 1 application topically as needed (apply to affected area of buttocks).   Yes Historical Provider, MD  lisinopril (PRINIVIL,ZESTRIL) 2.5 MG tablet Take 1 tablet (2.5 mg total) by mouth daily. Patient not taking: Reported on 10/31/2014 06/29/14   Theda Belfast Dhungel, MD  loperamide (IMODIUM) 2 MG capsule Take 1 capsule (2 mg total) by mouth 4 (four) times daily as needed for diarrhea or loose stools. 01/10/14   Gerhard Munch, MD  meloxicam (MOBIC) 15 MG tablet Take 1 tablet (15 mg total) by mouth daily. Patient not taking: Reported on 10/31/2014 06/16/14   Gerhard Munch, MD   BP 158/89 mmHg  Pulse 80  Temp(Src) 97.5 F (36.4 C) (Oral)  Resp 16  SpO2 96%  Vital signs normal   Physical Exam  Constitutional: He appears well-developed and well-nourished.  Non-toxic appearance. He does not appear ill. No distress.  HENT:  Head:  Normocephalic and atraumatic.  Right Ear: External ear normal.  Left Ear: External ear normal.  Nose: Nose normal. No mucosal edema or rhinorrhea.  Mouth/Throat: Oropharynx is clear and moist and mucous membranes are normal. No dental abscesses or uvula swelling.  Eyes: Conjunctivae and EOM are normal. Pupils are equal, round, and reactive to light.  Neck: Full passive range of motion without pain.  Patient was immobilized by EMS with towels.  Cardiovascular: Normal rate, regular rhythm and normal heart sounds.  Exam reveals no gallop and no friction rub.   No murmur  heard. Pulmonary/Chest: Effort normal and breath sounds normal. No respiratory distress. He has no wheezes. He has no rhonchi. He has no rales. He exhibits no tenderness and no crepitus.  Abdominal: Soft. Normal appearance and bowel sounds are normal. He exhibits no distension. There is no tenderness. There is no rebound and no guarding.  Musculoskeletal: Normal range of motion. He exhibits no edema or tenderness.  On ROM of his extremities, he has pain in his left shoulder. And when he flexes his knees he complains of pain in his knees. He has well-healed old surgical scars over both knees that are consistent with total knee replacement. There is no bruising, swelling, or abrasions seen to his knees. He has no pain in his hips when he flexes his knees. Patient has diffuse pain of his spine.  Neurological: He is alert. He has normal strength. No cranial nerve deficit.  Pt has memory problems.   Skin: Skin is warm, dry and intact. No rash noted. No erythema. No pallor.  Psychiatric: He has a normal mood and affect. His speech is normal and behavior is normal. His mood appears not anxious.  Nursing note and vitals reviewed.   ED Course  Procedures (including critical care time)  Medications  HYDROcodone-acetaminophen (NORCO/VICODIN) 5-325 MG per tablet 1 tablet (not administered)   Review of prior records shows she had a CBC/Bmet done on Jan 9th that was normal and he has had 3 UA in the past 9 months, the last in October that were all normal.   Patient was seen in the emergency department on January 9 for back pain, he was seen in October 2015 after a fall with back pain, he was seen September 29 and September 30 after falling.    Labs Review Labs Reviewed - No data to display  Imaging Review Dg Thoracic Spine 2 View  11/01/2014   CLINICAL DATA:  Fall  EXAM: THORACIC SPINE - 2 VIEW  COMPARISON:  06/27/2014  FINDINGS: There is mild thoracolumbar curvature. There is no evidence of thoracic  spine fracture. Moderately severe degenerative disc changes are present. No bone lesion or bony destruction is evident.  IMPRESSION: No evidence of acute thoracic spine fracture. Moderate curvature and degenerative disc changes are present.   Electronically Signed   By: Ellery Plunk M.D.   On: 11/01/2014 00:26   Dg Lumbar Spine Complete  11/01/2014   CLINICAL DATA:  Status post fall. Concern for lower back injury. Initial encounter.  EXAM: LUMBAR SPINE - COMPLETE 4+ VIEW  COMPARISON:  Lumbar spine radiographs performed 09/26/2014, and MRI of the lumbar spine performed 07/01/2014  FINDINGS: The previously noted compression fracture at vertebral body L4 demonstrates minimally increased compression but is otherwise grossly unremarkable. No significant retropulsion seen. There is slight chronic loss of height at L1 and L5, stable in appearance. Intervertebral disc space narrowing is noted along the lower thoracic and upper lumbar spine, and at  L5-S1. Underlying facet disease is noted.  The visualized bowel gas pattern is unremarkable in appearance; air and stool are noted within the colon. The sacroiliac joints are within normal limits.  IMPRESSION: 1. No evidence of acute fracture or subluxation along the lumbar spine. 2. Compression fracture of vertebral body L4 demonstrates perhaps minimally increased compression but is otherwise stable from prior studies. 3. Slight chronic loss of height at L1 and L5 are unchanged in appearance. 4. Mild underlying degenerative change along the lower thoracic and lumbar spine.   Electronically Signed   By: Roanna RaiderJeffery  Chang M.D.   On: 11/01/2014 00:39   Ct Head Wo Contrast  Ct Cervical Spine Wo Contrast  11/01/2014   CLINICAL DATA:  Unwitnessed fall at nursing home; concern for head or cervical spine injury. Question of loss of consciousness. Initial encounter.  EXAM: CT HEAD WITHOUT CONTRAST  CT CERVICAL SPINE WITHOUT CONTRAST  TECHNIQUE: Multidetector CT imaging of the  head and cervical spine was performed following the standard protocol without intravenous contrast. Multiplanar CT image reconstructions of the cervical spine were also generated.  COMPARISON:  CT of the head and cervical spine performed 09/26/2014, and MRI/MRA of the brain performed 03/01/2011  FINDINGS: CT HEAD FINDINGS  There is no evidence of acute infarction, mass lesion, or intra- or extra-axial hemorrhage on CT.  Prominence of the ventricles and sulci reflects moderate cortical volume loss. Cerebellar atrophy is noted. Scattered periventricular and subcortical white matter change likely reflects small vessel ischemic microangiopathy. Chronic ischemic change is noted at the basal ganglia bilaterally.  The brainstem and fourth ventricle are within normal limits. The basal ganglia are unremarkable in appearance. The cerebral hemispheres demonstrate grossly normal gray-white differentiation. No mass effect or midline shift is seen.  There is no evidence of fracture; visualized osseous structures are unremarkable in appearance. The orbits are within normal limits. The paranasal sinuses and mastoid air cells are well-aerated. No significant soft tissue abnormalities are seen.  CT CERVICAL SPINE FINDINGS  There is no evidence of fracture or subluxation. Mild disc space narrowing is noted at C6-C7, with associated endplate sclerosis and small anterior and posterior disc osteophyte complexes. Underlying facet disease is noted along the cervical spine. Vertebral bodies demonstrate normal height and alignment. Prevertebral soft tissues are within normal limits.  The visualized portions of the thyroid gland are unremarkable in appearance. The visualized lung apices are clear. Calcification is seen at the carotid bifurcations bilaterally.  IMPRESSION: 1. No evidence of traumatic intracranial injury or fracture. 2. No evidence of fracture or subluxation along the cervical spine. 3. Moderate cortical volume loss and  scattered small vessel ischemic microangiopathy. 4. Chronic ischemic change at the basal ganglia bilaterally. 5. Mild degenerative change at the lower cervical spine. 6. Calcification at the carotid bifurcations bilaterally. Carotid ultrasound would be helpful for further evaluation, when and as deemed clinically appropriate.   Electronically Signed   By: Roanna RaiderJeffery  Chang M.D.   On: 11/01/2014 00:43   Dg Shoulder Left  11/01/2014   CLINICAL DATA:  Status post fall; left shoulder pain. Initial encounter.  EXAM: LEFT SHOULDER - 2+ VIEW  COMPARISON:  None.  FINDINGS: There is no evidence of acute fracture or dislocation. Degenerative change is noted at the left glenohumeral joint, with joint space loss and prominent osteophyte formation. Surrounding heterotopic bone formation is noted.  Mild degenerative change is noted at the left acromioclavicular joint. The visualized portions of the left lung are grossly clear. No significant soft tissue abnormalities  are characterized on radiograph.  IMPRESSION: No evidence of acute fracture or dislocation. Significant degenerative change at the left glenohumeral joint, with joint space loss and prominent osteophyte formation. Surrounding heterotopic bone formation noted.   Electronically Signed   By: Roanna Raider M.D.   On: 11/01/2014 00:31     EKG Interpretation   Date/Time:  Saturday October 31 2014 23:24:52 EST Ventricular Rate:  91 PR Interval:    QRS Duration: 112 QT Interval:  409 QTC Calculation: 503 R Axis:   -39 Text Interpretation:  Atrial fibrillation Ventricular premature complex  LVH with secondary repolarization abnormality Anterior infarct, old  Prolonged QT interval No significant change since last tracing 29 Jun 2014  Confirmed by [facility]  MD-I, Tru Rana ([id number]) on 10/31/2014 11:33:41 PM      MDM   Final diagnoses:  Fall at nursing home, initial encounter  Midline low back pain without sciatica  Shoulder pain, left  Neck pain    Plan  discharge  Devoria Albe, MD, Franz Dell, MD 11/01/14 [phone]

## 2014-11-01 NOTE — ED Notes (Signed)
[facility] called for transport back home.  [facility] [place] and gave report to FremontMichelle.

## 2014-11-01 NOTE — Discharge Instructions (Signed)
Your xrays today of your shoulder show arthritic changes, your back doesn't have any new changes. Your CT scan of your head or neck do not show anything new. Contact Dr Nehemiah SettlePolite about his pain medication. Recheck as needed.

## 2015-01-26 ENCOUNTER — Inpatient Hospital Stay (HOSPITAL_COMMUNITY)
Admission: EM | Admit: 2015-01-26 | Discharge: 2015-01-29 | DRG: 308 | Disposition: A | Discharge status: Skilled Nursing Facility | Payer: Medicare Other | Attending: Internal Medicine | Admitting: Internal Medicine

## 2015-01-26 DIAGNOSIS — F0280 Dementia in other diseases classified elsewhere without behavioral disturbance: Secondary | ICD-10-CM | POA: Diagnosis present

## 2015-01-26 DIAGNOSIS — Z66 Do not resuscitate: Secondary | ICD-10-CM | POA: Diagnosis present

## 2015-01-26 DIAGNOSIS — I4891 Unspecified atrial fibrillation: Principal | ICD-10-CM | POA: Diagnosis present

## 2015-01-26 DIAGNOSIS — G309 Alzheimer's disease, unspecified: Secondary | ICD-10-CM | POA: Diagnosis present

## 2015-01-26 DIAGNOSIS — R0602 Shortness of breath: Secondary | ICD-10-CM | POA: Insufficient documentation

## 2015-01-26 DIAGNOSIS — I5023 Acute on chronic systolic (congestive) heart failure: Secondary | ICD-10-CM | POA: Diagnosis present

## 2015-01-26 DIAGNOSIS — I351 Nonrheumatic aortic (valve) insufficiency: Secondary | ICD-10-CM | POA: Diagnosis present

## 2015-01-26 DIAGNOSIS — R945 Abnormal results of liver function studies: Secondary | ICD-10-CM | POA: Diagnosis present

## 2015-01-26 DIAGNOSIS — Z9049 Acquired absence of other specified parts of digestive tract: Secondary | ICD-10-CM | POA: Diagnosis present

## 2015-01-26 DIAGNOSIS — N179 Acute kidney failure, unspecified: Secondary | ICD-10-CM | POA: Diagnosis present

## 2015-01-26 DIAGNOSIS — R451 Restlessness and agitation: Secondary | ICD-10-CM | POA: Diagnosis present

## 2015-01-26 DIAGNOSIS — E8779 Other fluid overload: Secondary | ICD-10-CM | POA: Insufficient documentation

## 2015-01-26 DIAGNOSIS — K760 Fatty (change of) liver, not elsewhere classified: Secondary | ICD-10-CM | POA: Diagnosis present

## 2015-01-26 DIAGNOSIS — M1990 Unspecified osteoarthritis, unspecified site: Secondary | ICD-10-CM | POA: Diagnosis present

## 2015-01-26 DIAGNOSIS — Z515 Encounter for palliative care: Secondary | ICD-10-CM

## 2015-01-26 DIAGNOSIS — N390 Urinary tract infection, site not specified: Secondary | ICD-10-CM | POA: Diagnosis present

## 2015-01-26 DIAGNOSIS — R739 Hyperglycemia, unspecified: Secondary | ICD-10-CM | POA: Diagnosis present

## 2015-01-26 DIAGNOSIS — I340 Nonrheumatic mitral (valve) insufficiency: Secondary | ICD-10-CM | POA: Diagnosis present

## 2015-01-26 DIAGNOSIS — I10 Essential (primary) hypertension: Secondary | ICD-10-CM | POA: Diagnosis present

## 2015-01-26 DIAGNOSIS — I959 Hypotension, unspecified: Secondary | ICD-10-CM | POA: Diagnosis present

## 2015-01-26 HISTORY — DX: Reserved for concepts with insufficient information to code with codable children: IMO0002

## 2015-01-26 HISTORY — DX: Unspecified atrial fibrillation: I48.91

## 2015-01-26 HISTORY — DX: Heart failure, unspecified: I50.9

## 2015-01-26 HISTORY — DX: Nonrheumatic aortic (valve) insufficiency: I35.1

## 2015-01-26 HISTORY — DX: Nonrheumatic mitral (valve) insufficiency: I34.0

## 2015-01-26 MED ORDER — MAGNESIUM SULFATE 2 GM/50ML IV SOLN
2.0000 g | Freq: Once | INTRAVENOUS | Status: AC
  Administered 2015-01-27: 2 g via INTRAVENOUS
  Filled 2015-01-26: qty 50

## 2015-01-26 MED ORDER — FUROSEMIDE 10 MG/ML IJ SOLN
40.0000 mg | Freq: Once | INTRAMUSCULAR | Status: AC
  Administered 2015-01-27: 40 mg via INTRAVENOUS
  Filled 2015-01-26: qty 4

## 2015-01-26 NOTE — ED Provider Notes (Signed)
CSN: [id number]     Arrival date & time 01/26/15  2342 History  This chart was scribed for Tomasita CrumbleAdeleke Jaeson Molstad, MD by Bronson CurbJacqueline Melvin, ED Scribe. This patient was seen in room D36C/D36C and the patient's care was started at 11:46 PM.     Chief Complaint  Patient presents with  . Altered Mental Status  . Wheezing    LEVEL 5 CAVEAT: DEMENTIA  The history is provided by the EMS personnel. The history is limited by the condition of the patient. No language interpreter was used.    HPI Comments: Sean FillersDonald Philipp is a 81 y.o. male, with history of HTN and Dementia, brought in by ambulance, who presents to the Emergency Department for AMS. Per EMS, patient is from a nursing home and is typically amicable and kind toward the nursing home staff. However, EMS reports patient became agitated, hostile, and grumpy toward nursing home staff approximately 6 hours ago. EMS notes respiratory distress with associated wheezing and pedal edema. EMS also suspects possibly new onset A-fib with a heart rate in the 120-140 range.    Past Medical History  Diagnosis Date  . Hypertension   . Dementia   . Osteoarthritis    Past Surgical History  Procedure Laterality Date  . Appendectomy     No family history on file. History  Substance Use Topics  . Smoking status: Never Smoker   . Smokeless tobacco: Not on file  . Alcohol Use: Yes     Comment: occasionally    Review of Systems  Unable to perform ROS: Dementia      Allergies  Review of patient's allergies indicates no known allergies.  Home Medications   Prior to Admission medications   Medication Sig Start Date End Date Taking? Authorizing Provider  albuterol (PROVENTIL) (2.5 MG/3ML) 0.083% nebulizer solution Take 2.5 mg by nebulization 2 (two) times daily.    Historical Provider, MD  aspirin 81 MG chewable tablet Chew 81 mg by mouth daily.    Historical Provider, MD  donepezil (ARICEPT) 10 MG tablet Take 10 mg by mouth at bedtime.    Historical  Provider, MD  dutasteride (AVODART) 0.5 MG capsule Take 1 capsule (0.5 mg total) by mouth at bedtime. 06/29/14   Nishant Dhungel, MD  feeding supplement (ENSURE IMMUNE HEALTH) LIQD Take 237 mLs by mouth 2 (two) times daily.    Historical Provider, MD  furosemide (LASIX) 40 MG tablet Take 1 tablet (40 mg total) by mouth 2 (two) times daily. 03/24/14   Renford Dillsonald Polite, MD  HYDROcodone-acetaminophen (NORCO/VICODIN) 5-325 MG per tablet Take 1 tablet by mouth every 4 (four) hours as needed. Patient taking differently: Take 1 tablet by mouth 2 (two) times daily.  09/26/14   Garlon HatchetLisa M Sanders, PA-C  lisinopril (PRINIVIL,ZESTRIL) 2.5 MG tablet Take 1 tablet (2.5 mg total) by mouth daily. Patient not taking: Reported on 10/31/2014 06/29/14   Theda BelfastNishant Dhungel, MD  loperamide (IMODIUM) 2 MG capsule Take 1 capsule (2 mg total) by mouth 4 (four) times daily as needed for diarrhea or loose stools. 01/10/14   Gerhard Munchobert Lockwood, MD  meloxicam (MOBIC) 15 MG tablet Take 1 tablet (15 mg total) by mouth daily. Patient not taking: Reported on 10/31/2014 06/16/14   Gerhard Munchobert Lockwood, MD  polyethylene glycol [facility](MIRALAX / Ethelene HalGLYCOLAX) packet Take 17 g by mouth daily.    Historical Provider, MD  potassium chloride SA (K-DUR,KLOR-CON) 20 MEQ tablet Take 1 tablet (20 mEq total) by mouth daily. 03/24/14   Renford Dillsonald Polite, MD  ZINC OXIDE  EX Apply 1 application topically as needed (apply to affected area of buttocks).    Historical Provider, MD   BP 123/82 mmHg  Pulse 114  Resp 32  SpO2 100% Physical Exam  Constitutional: He is oriented to person, place, and time. Vital signs are normal. He appears well-developed and well-nourished.  Non-toxic appearance. He does not appear ill. No distress. Face mask in place.  HENT:  Head: Normocephalic and atraumatic.  Nose: Nose normal.  Mouth/Throat: Oropharynx is clear and moist. No oropharyngeal exudate.  Eyes: Conjunctivae and EOM are normal. Pupils are equal, round, and reactive to light. No scleral  icterus.  Neck: Normal range of motion. Neck supple. JVD present. No tracheal deviation, no edema, no erythema and normal range of motion present. No thyroid mass and no thyromegaly present.  Cardiovascular: S1 normal, S2 normal, normal heart sounds, intact distal pulses and normal pulses.  An irregularly irregular rhythm present. Tachycardia present.  Exam reveals no gallop and no friction rub.   No murmur heard. Pulses:      Radial pulses are 2+ on the right side, and 2+ on the left side.       Dorsalis pedis pulses are 2+ on the right side, and 2+ on the left side.  Pulmonary/Chest: Tachypnea noted. He is in respiratory distress. He has wheezes. He has no rhonchi. He has rales.  Wheezes and rales bilaterally.  Abdominal: Soft. Normal appearance and bowel sounds are normal. He exhibits no distension, no ascites and no mass. There is no hepatosplenomegaly. There is no tenderness. There is no rebound, no guarding and no CVA tenderness.  Musculoskeletal: Normal range of motion. He exhibits edema. He exhibits no tenderness.  2+ bilateral lower extremity edema to the knees.  Lymphadenopathy:    He has no cervical adenopathy.  Neurological: He is alert and oriented to person, place, and time. He has normal strength. No cranial nerve deficit or sensory deficit.  Skin: Skin is warm, dry and intact. No petechiae and no rash noted. He is not diaphoretic. No erythema. No pallor.  Psychiatric: He has a normal mood and affect. His behavior is normal. Judgment normal.  Nursing note and vitals reviewed.   ED Course  Procedures (including critical care time)  DIAGNOSTIC STUDIES: Oxygen Saturation is 100% on  (15L/min), normal by my interpretation.    COORDINATION OF CARE:  Labs Review Labs Reviewed  CBC WITH DIFFERENTIAL/PLATELET - Abnormal; Notable for the following:    MCV 103.1 (*)    MCH 35.2 (*)    Platelets 144 (*)    All other components within normal limits  COMPREHENSIVE METABOLIC  PANEL - Abnormal; Notable for the following:    Chloride 100 (*)    Glucose, Bld 118 (*)    BUN 33 (*)    Creatinine, Ser 1.26 (*)    Calcium 8.7 (*)    Total Protein 6.4 (*)    Albumin 3.0 (*)    AST 85 (*)    Alkaline Phosphatase 359 (*)    Total Bilirubin 1.5 (*)    GFR calc non Af Amer 49 (*)    GFR calc Af Amer 57 (*)    All other components within normal limits  LIPASE, BLOOD - Abnormal; Notable for the following:    Lipase 17 (*)    All other components within normal limits  PROTIME-INR - Abnormal; Notable for the following:    Prothrombin Time 17.8 (*)    All other components within normal limits  BRAIN NATRIURETIC PEPTIDE - Abnormal; Notable for the following:    B Natriuretic Peptide 1452.7 (*)    All other components within normal limits  URINALYSIS, ROUTINE W REFLEX MICROSCOPIC - Abnormal; Notable for the following:    Color, Urine AMBER (*)    APPearance CLOUDY (*)    Hgb urine dipstick TRACE (*)    Leukocytes, UA LARGE (*)    All other components within normal limits  URINE MICROSCOPIC-ADD ON - Abnormal; Notable for the following:    Bacteria, UA MANY (*)    All other components within normal limits  I-STAT VENOUS BLOOD GAS, ED - Abnormal; Notable for the following:    pH, Ven 7.379 (*)    pCO2, Ven 42.3 (*)    pO2, Ven 29.0 (*)    Bicarbonate 25.0 (*)    All other components within normal limits  MAGNESIUM  BLOOD GAS, VENOUS  I-STAT CG4 LACTIC ACID, ED  Rosezena Sensor, ED    Imaging Review Dg Chest 2 View  01/27/2015   CLINICAL DATA:  Shortness of breath.  EXAM: CHEST  2 VIEW  COMPARISON:  06/27/2014  FINDINGS: Borderline heart size and pulmonary vascularity. Small bilateral pleural effusions with basilar atelectasis. No airspace disease. No pneumothorax. Calcified and tortuous aorta. Degenerative changes in the spine and shoulders. Calcified lymph nodes in the right hilum.  IMPRESSION: Borderline cardiac enlargement and pulmonary vascular congestion  with small bilateral pleural effusions and basilar atelectasis.   Electronically Signed   By: Burman Nieves M.D.   On: 01/27/2015 00:59     EKG Interpretation   Date/Time:  Wednesday Jan 27 2015 00:02:27 EDT Ventricular Rate:  110 PR Interval:    QRS Duration: 127 QT Interval:  356 QTC Calculation: 482 R Axis:   -5 Text Interpretation:  Atrial fibrillation LVH with secondary  repolarization abnormality Probable anterior infarct, age indeterminate ST  depression in V4-V6 No significant change since last tracing Confirmed by  Erroll Luna [phone]) on 01/27/2015 12:08:16 AM      MDM   Final diagnoses:  None   Patient does emergency department for hostility and agitation and wheezing. Patient's history of dementia and cannot provide his own history. Per the nursing facility, history of edema is new as well as A. fib. Upon chart review, prior EKGs show atrial fibrillation. He is currently rate controlled in the emergency department. Will continue workup for altered mental status including rectal temperature, laboratory studies, chest x-ray, urinalysis. Patient continues to wheeze on exam. He was ordered IV Lasix for diuresis. Bedside ultrasound reveals B-lines suggesting fluid overload.  Patient will require admission to the hospital for continued diuresis.  Altered mental status workup reveals a urinary tract infection. Patient was given 1 g of ceftriaxone. Patient subsequently has been in and out of A. fib with RVR. He'll be given bolus of diltiazem for treatment. Will speak with [facility] hospitalist for admission.   Emergency Ultrasound: Limited Thoracic Performed and interpreted by Dr Mora Bellman Longitudinal view of anterior left and right lung fields in real-time with linear probe. Indication: Shortness of breath Findings: positive lung sliding positive B lines Interpretation: No evidence of pneumothorax. Images electronically archived.       I personally performed the  services described in this documentation, which was scribed in my presence. The recorded information has been reviewed and is accurate.   Tomasita Crumble, MD 01/27/15 [phone]

## 2015-01-26 NOTE — ED Notes (Signed)
Per ems- called out to nursing home for dementia patient with increased hostility and agitation and also wheezing; ems reports noting pedal edema and new onset Afib rate in the 120-130's; no known hx of afib per nursing facility; 125mg  solumedrol, 10mg  albuterol, 1mg  atrovent given by ems enroute;

## 2015-01-27 ENCOUNTER — Emergency Department (HOSPITAL_COMMUNITY): Payer: Medicare Other

## 2015-01-27 ENCOUNTER — Ambulatory Visit (HOSPITAL_COMMUNITY): Payer: Medicare Other

## 2015-01-27 ENCOUNTER — Inpatient Hospital Stay (HOSPITAL_COMMUNITY): Payer: Medicare Other

## 2015-01-27 ENCOUNTER — Encounter (HOSPITAL_COMMUNITY): Payer: Self-pay | Admitting: Emergency Medicine

## 2015-01-27 DIAGNOSIS — I4891 Unspecified atrial fibrillation: Secondary | ICD-10-CM | POA: Diagnosis not present

## 2015-01-27 DIAGNOSIS — I10 Essential (primary) hypertension: Secondary | ICD-10-CM | POA: Diagnosis not present

## 2015-01-27 DIAGNOSIS — I509 Heart failure, unspecified: Secondary | ICD-10-CM

## 2015-01-27 DIAGNOSIS — I351 Nonrheumatic aortic (valve) insufficiency: Secondary | ICD-10-CM | POA: Diagnosis present

## 2015-01-27 DIAGNOSIS — E8770 Fluid overload, unspecified: Secondary | ICD-10-CM | POA: Diagnosis not present

## 2015-01-27 DIAGNOSIS — K760 Fatty (change of) liver, not elsewhere classified: Secondary | ICD-10-CM | POA: Diagnosis present

## 2015-01-27 DIAGNOSIS — K7689 Other specified diseases of liver: Secondary | ICD-10-CM | POA: Diagnosis not present

## 2015-01-27 DIAGNOSIS — Z66 Do not resuscitate: Secondary | ICD-10-CM | POA: Diagnosis present

## 2015-01-27 DIAGNOSIS — I959 Hypotension, unspecified: Secondary | ICD-10-CM | POA: Diagnosis present

## 2015-01-27 DIAGNOSIS — F0280 Dementia in other diseases classified elsewhere without behavioral disturbance: Secondary | ICD-10-CM | POA: Diagnosis present

## 2015-01-27 DIAGNOSIS — N179 Acute kidney failure, unspecified: Secondary | ICD-10-CM | POA: Diagnosis present

## 2015-01-27 DIAGNOSIS — I5023 Acute on chronic systolic (congestive) heart failure: Secondary | ICD-10-CM | POA: Diagnosis present

## 2015-01-27 DIAGNOSIS — R945 Abnormal results of liver function studies: Secondary | ICD-10-CM | POA: Diagnosis present

## 2015-01-27 DIAGNOSIS — I340 Nonrheumatic mitral (valve) insufficiency: Secondary | ICD-10-CM | POA: Diagnosis present

## 2015-01-27 DIAGNOSIS — R739 Hyperglycemia, unspecified: Secondary | ICD-10-CM | POA: Diagnosis not present

## 2015-01-27 DIAGNOSIS — G309 Alzheimer's disease, unspecified: Secondary | ICD-10-CM | POA: Diagnosis present

## 2015-01-27 DIAGNOSIS — Z515 Encounter for palliative care: Secondary | ICD-10-CM | POA: Diagnosis not present

## 2015-01-27 DIAGNOSIS — R0602 Shortness of breath: Secondary | ICD-10-CM | POA: Diagnosis present

## 2015-01-27 DIAGNOSIS — R451 Restlessness and agitation: Secondary | ICD-10-CM | POA: Diagnosis present

## 2015-01-27 DIAGNOSIS — M1990 Unspecified osteoarthritis, unspecified site: Secondary | ICD-10-CM | POA: Diagnosis present

## 2015-01-27 DIAGNOSIS — N390 Urinary tract infection, site not specified: Secondary | ICD-10-CM | POA: Diagnosis present

## 2015-01-27 DIAGNOSIS — Z9049 Acquired absence of other specified parts of digestive tract: Secondary | ICD-10-CM | POA: Diagnosis present

## 2015-01-27 LAB — HEPATITIS PANEL, ACUTE
HCV Ab: NEGATIVE
HEP B C IGM: NONREACTIVE
Hep A IgM: NONREACTIVE
Hepatitis B Surface Ag: NEGATIVE

## 2015-01-27 LAB — TROPONIN I
TROPONIN I: 0.17 ng/mL — AB (ref <0.031)
Troponin I: 0.35 ng/mL — ABNORMAL HIGH (ref <0.031)
Troponin I: 0.53 ng/mL (ref <0.031)

## 2015-01-27 LAB — CBC WITH DIFFERENTIAL/PLATELET
BASOS PCT: 0 % (ref 0–1)
Basophils Absolute: 0 10*3/uL (ref 0.0–0.1)
EOS ABS: 0.2 10*3/uL (ref 0.0–0.7)
Eosinophils Relative: 2 % (ref 0–5)
HEMATOCRIT: 46.8 % (ref 39.0–52.0)
Hemoglobin: 16 g/dL (ref 13.0–17.0)
LYMPHS ABS: 3.4 10*3/uL (ref 0.7–4.0)
Lymphocytes Relative: 38 % (ref 12–46)
MCH: 35.2 pg — AB (ref 26.0–34.0)
MCHC: 34.2 g/dL (ref 30.0–36.0)
MCV: 103.1 fL — ABNORMAL HIGH (ref 78.0–100.0)
MONOS PCT: 10 % (ref 3–12)
Monocytes Absolute: 0.8 10*3/uL (ref 0.1–1.0)
NEUTROS PCT: 50 % (ref 43–77)
Neutro Abs: 4.4 10*3/uL (ref 1.7–7.7)
Platelets: 144 10*3/uL — ABNORMAL LOW (ref 150–400)
RBC: 4.54 MIL/uL (ref 4.22–5.81)
RDW: 14 % (ref 11.5–15.5)
WBC: 8.8 10*3/uL (ref 4.0–10.5)

## 2015-01-27 LAB — URINALYSIS, ROUTINE W REFLEX MICROSCOPIC
Bilirubin Urine: NEGATIVE
GLUCOSE, UA: NEGATIVE mg/dL
Ketones, ur: NEGATIVE mg/dL
Nitrite: NEGATIVE
Protein, ur: NEGATIVE mg/dL
SPECIFIC GRAVITY, URINE: 1.021 (ref 1.005–1.030)
Urobilinogen, UA: 1 mg/dL (ref 0.0–1.0)
pH: 6.5 (ref 5.0–8.0)

## 2015-01-27 LAB — PROTIME-INR
INR: 1.45 (ref 0.00–1.49)
Prothrombin Time: 17.8 seconds — ABNORMAL HIGH (ref 11.6–15.2)

## 2015-01-27 LAB — COMPREHENSIVE METABOLIC PANEL
ALBUMIN: 3 g/dL — AB (ref 3.5–5.0)
ALT: 52 U/L (ref 17–63)
AST: 85 U/L — ABNORMAL HIGH (ref 15–41)
Alkaline Phosphatase: 359 U/L — ABNORMAL HIGH (ref 38–126)
Anion gap: 12 (ref 5–15)
BILIRUBIN TOTAL: 1.5 mg/dL — AB (ref 0.3–1.2)
BUN: 33 mg/dL — ABNORMAL HIGH (ref 6–20)
CO2: 25 mmol/L (ref 22–32)
CREATININE: 1.26 mg/dL — AB (ref 0.61–1.24)
Calcium: 8.7 mg/dL — ABNORMAL LOW (ref 8.9–10.3)
Chloride: 100 mmol/L — ABNORMAL LOW (ref 101–111)
GFR calc Af Amer: 57 mL/min — ABNORMAL LOW (ref >60)
GFR calc non Af Amer: 49 mL/min — ABNORMAL LOW (ref >60)
Glucose, Bld: 118 mg/dL — ABNORMAL HIGH (ref 70–99)
Potassium: 4.8 mmol/L (ref 3.5–5.1)
Sodium: 137 mmol/L (ref 135–145)
Total Protein: 6.4 g/dL — ABNORMAL LOW (ref 6.5–8.1)

## 2015-01-27 LAB — I-STAT VENOUS BLOOD GAS, ED
BICARBONATE: 25 meq/L — AB (ref 20.0–24.0)
O2 SAT: 53 %
PO2 VEN: 29 mmHg — AB (ref 30.0–45.0)
TCO2: 26 mmol/L (ref 0–100)
pCO2, Ven: 42.3 mmHg — ABNORMAL LOW (ref 45.0–50.0)
pH, Ven: 7.379 — ABNORMAL HIGH (ref 7.250–7.300)

## 2015-01-27 LAB — D-DIMER, QUANTITATIVE (NOT AT ARMC): D DIMER QUANT: 1.89 ug{FEU}/mL — AB (ref 0.00–0.48)

## 2015-01-27 LAB — TSH: TSH: 1.209 u[IU]/mL (ref 0.350–4.500)

## 2015-01-27 LAB — URINE MICROSCOPIC-ADD ON

## 2015-01-27 LAB — GAMMA GT: GGT: 221 U/L — AB (ref 7–50)

## 2015-01-27 LAB — MRSA PCR SCREENING: MRSA by PCR: POSITIVE — AB

## 2015-01-27 LAB — I-STAT TROPONIN, ED: TROPONIN I, POC: 0.02 ng/mL (ref 0.00–0.08)

## 2015-01-27 LAB — BRAIN NATRIURETIC PEPTIDE: B NATRIURETIC PEPTIDE 5: 1452.7 pg/mL — AB (ref 0.0–100.0)

## 2015-01-27 LAB — LIPASE, BLOOD: Lipase: 17 U/L — ABNORMAL LOW (ref 22–51)

## 2015-01-27 LAB — I-STAT CG4 LACTIC ACID, ED: Lactic Acid, Venous: 1.98 mmol/L (ref 0.5–2.0)

## 2015-01-27 LAB — MAGNESIUM: Magnesium: 2.3 mg/dL (ref 1.7–2.4)

## 2015-01-27 MED ORDER — ASPIRIN 81 MG PO CHEW
81.0000 mg | CHEWABLE_TABLET | Freq: Every day | ORAL | Status: DC
  Administered 2015-01-27 – 2015-01-29 (×3): 81 mg via ORAL
  Filled 2015-01-27 (×3): qty 1

## 2015-01-27 MED ORDER — ALBUTEROL SULFATE (2.5 MG/3ML) 0.083% IN NEBU
2.5000 mg | INHALATION_SOLUTION | Freq: Two times a day (BID) | RESPIRATORY_TRACT | Status: DC
  Administered 2015-01-27 – 2015-01-29 (×5): 2.5 mg via RESPIRATORY_TRACT
  Filled 2015-01-27 (×5): qty 3

## 2015-01-27 MED ORDER — DILTIAZEM LOAD VIA INFUSION
10.0000 mg | Freq: Once | INTRAVENOUS | Status: DC
  Filled 2015-01-27: qty 10

## 2015-01-27 MED ORDER — DILTIAZEM HCL 25 MG/5ML IV SOLN
5.0000 mg | Freq: Once | INTRAVENOUS | Status: AC
  Administered 2015-01-27: 5 mg via INTRAVENOUS
  Filled 2015-01-27: qty 5

## 2015-01-27 MED ORDER — POTASSIUM CHLORIDE CRYS ER 20 MEQ PO TBCR
20.0000 meq | EXTENDED_RELEASE_TABLET | Freq: Every day | ORAL | Status: DC
  Administered 2015-01-27 – 2015-01-29 (×3): 20 meq via ORAL
  Filled 2015-01-27 (×3): qty 1

## 2015-01-27 MED ORDER — SODIUM CHLORIDE 0.9 % IJ SOLN: 3.0000 mL | INTRAMUSCULAR | Status: DC | PRN

## 2015-01-27 MED ORDER — DEXTROSE 5 % IV SOLN
1.0000 g | INTRAVENOUS | Status: DC
  Administered 2015-01-28: 1 g via INTRAVENOUS
  Filled 2015-01-27 (×2): qty 10

## 2015-01-27 MED ORDER — ENOXAPARIN SODIUM 30 MG/0.3ML ~~LOC~~ SOLN
30.0000 mg | SUBCUTANEOUS | Status: DC
  Administered 2015-01-27 – 2015-01-28 (×2): 30 mg via SUBCUTANEOUS
  Filled 2015-01-27 (×2): qty 0.3

## 2015-01-27 MED ORDER — DILTIAZEM HCL 100 MG IV SOLR
5.0000 mg/h | INTRAVENOUS | Status: DC
  Administered 2015-01-27 (×2): 5 mg/h via INTRAVENOUS
  Filled 2015-01-27: qty 100

## 2015-01-27 MED ORDER — FUROSEMIDE 10 MG/ML IJ SOLN
40.0000 mg | Freq: Once | INTRAMUSCULAR | Status: AC
  Administered 2015-01-27: 40 mg via INTRAVENOUS
  Filled 2015-01-27: qty 4

## 2015-01-27 MED ORDER — FUROSEMIDE 40 MG PO TABS
40.0000 mg | ORAL_TABLET | Freq: Two times a day (BID) | ORAL | Status: DC
  Administered 2015-01-27: 40 mg via ORAL
  Filled 2015-01-27: qty 1

## 2015-01-27 MED ORDER — CEFTRIAXONE SODIUM 1 G IJ SOLR
1.0000 g | Freq: Once | INTRAMUSCULAR | Status: AC
  Administered 2015-01-27: 1 g via INTRAVENOUS
  Filled 2015-01-27: qty 10

## 2015-01-27 MED ORDER — ACETAMINOPHEN 650 MG RE SUPP: 650.0000 mg | Freq: Four times a day (QID) | RECTAL | Status: DC | PRN

## 2015-01-27 MED ORDER — DILTIAZEM HCL 25 MG/5ML IV SOLN
15.0000 mg | Freq: Once | INTRAVENOUS | Status: AC
  Administered 2015-01-27: 15 mg via INTRAVENOUS
  Filled 2015-01-27: qty 5

## 2015-01-27 MED ORDER — RESOURCE THICKENUP CLEAR PO POWD
ORAL | Status: DC | PRN
  Filled 2015-01-27: qty 125

## 2015-01-27 MED ORDER — CHLORHEXIDINE GLUCONATE CLOTH 2 % EX PADS
6.0000 | MEDICATED_PAD | Freq: Every day | CUTANEOUS | Status: DC
  Administered 2015-01-28 – 2015-01-29 (×2): 6 via TOPICAL

## 2015-01-27 MED ORDER — POLYETHYLENE GLYCOL 3350 17 G PO PACK
17.0000 g | PACK | Freq: Every day | ORAL | Status: DC
  Administered 2015-01-27 – 2015-01-29 (×3): 17 g via ORAL
  Filled 2015-01-27 (×3): qty 1

## 2015-01-27 MED ORDER — SODIUM CHLORIDE 0.9 % IJ SOLN: 3.0000 mL | Freq: Two times a day (BID) | INTRAMUSCULAR | Status: DC

## 2015-01-27 MED ORDER — ENSURE ENLIVE PO LIQD
237.0000 mL | Freq: Two times a day (BID) | ORAL | Status: DC
  Administered 2015-01-27 – 2015-01-29 (×2): 237 mL via ORAL

## 2015-01-27 MED ORDER — DILTIAZEM HCL 25 MG/5ML IV SOLN: 5.0000 mg | Freq: Once | INTRAVENOUS | Status: DC

## 2015-01-27 MED ORDER — MUPIROCIN 2 % EX OINT
1.0000 "application " | TOPICAL_OINTMENT | Freq: Two times a day (BID) | CUTANEOUS | Status: DC
  Administered 2015-01-27 – 2015-01-29 (×5): 1 via NASAL
  Filled 2015-01-27 (×2): qty 22

## 2015-01-27 MED ORDER — ACETAMINOPHEN 325 MG PO TABS: 650.0000 mg | ORAL_TABLET | Freq: Four times a day (QID) | ORAL | Status: DC | PRN

## 2015-01-27 MED ORDER — DONEPEZIL HCL 10 MG PO TABS
10.0000 mg | ORAL_TABLET | Freq: Every day | ORAL | Status: DC
  Administered 2015-01-27 – 2015-01-28 (×2): 10 mg via ORAL
  Filled 2015-01-27 (×2): qty 1

## 2015-01-27 MED ORDER — FUROSEMIDE 40 MG PO TABS
40.0000 mg | ORAL_TABLET | Freq: Two times a day (BID) | ORAL | Status: DC
  Administered 2015-01-28 – 2015-01-29 (×3): 40 mg via ORAL
  Filled 2015-01-27 (×3): qty 1

## 2015-01-27 MED ORDER — SODIUM CHLORIDE 0.9 % IJ SOLN
3.0000 mL | Freq: Two times a day (BID) | INTRAMUSCULAR | Status: DC
  Administered 2015-01-27 – 2015-01-28 (×2): 3 mL via INTRAVENOUS

## 2015-01-27 MED ORDER — DUTASTERIDE 0.5 MG PO CAPS
0.5000 mg | ORAL_CAPSULE | Freq: Every day | ORAL | Status: DC
  Administered 2015-01-27 – 2015-01-28 (×2): 0.5 mg via ORAL
  Filled 2015-01-27 (×5): qty 1

## 2015-01-27 MED ORDER — PERFLUTREN LIPID MICROSPHERE
1.0000 mL | INTRAVENOUS | Status: AC | PRN
  Administered 2015-01-27: 2 mL via INTRAVENOUS
  Filled 2015-01-27: qty 10

## 2015-01-27 MED ORDER — SODIUM CHLORIDE 0.9 % IV SOLN: 250.0000 mL | INTRAVENOUS | Status: DC | PRN

## 2015-01-27 NOTE — Consult Note (Signed)
CARDIOLOGY CONSULT NOTE   Patient ID: Sean Carlson MRN: [id number], DOB/AGE: [birth year]   Admit date: 01/26/2015 Date of Consult: 01/27/2015   Primary Physician: Kandice Hams, MD Primary Cardiologist: Seen by Dr. Harrington Challenger in July 2015  Pt. Profile Pt is an 81 yo male from a nursing home with hx of CHF (EF 40-45% in 03/2013), PAfib, MR, AR, and due to dementia personal history is limited.  He transported to the ER for AMS.  Was found to have wheezing and pedal edema per EMS. and CXR showed bilateral pleural effusions. He was found to be in Afib with RVR and was admitted for same.   Problem List  Past Medical History  Diagnosis Date  . Hypertension   . Dementia   . Osteoarthritis   . Compression fracture   . CHF (congestive heart failure)   . Atrial fibrillation     on ekg 06/28/2014  . Mitral regurgitation   . Aortic regurgitation     Past Surgical History  Procedure Laterality Date  . Appendectomy    . Back surgery       Allergies  No Known Allergies  HPI   The patient is a 81 year old Caucasian male with past medical history of hypertension, advanced Alzheimer's dementia, history of CHF in the setting of new onset atrial fibrillation. His mental status was very poor, majority of history was given at the bedside by his son.   Cardiology was initially seen the patient back in July 2015 when he arrived with heart failure and was noted to have new onset of atrial fibrillation. He was placed on Bystolic for rate control and discharged with PO Lasix. Echocardiogram obtained at that time showed EF 40-45%, diffuse hypokinesis, aVR, mild MR, mildly to moderately reduced RV EF. He was deemed not a candidate for systemic anticoagulation therapy given her severe dementia and risk for fall. He was readmitted by internal medicine service in October 2015 after several episodes of fall. It was felt to related to bradycardia and hypotension, his Bystolic was discontinued. His lisinopril was  reduced to 2.5 mg daily.  Since then, patient has been living in [facility] facility. According to his son, it has been awhile since he has seen his dad at his "normal". For the past week, his appetite has been declining. He was at a relative baseline on Monday at the PCP for his regular physical. He states the patient has not recently complained of any palpitations, increased weakness, or chest pains.  He states within the last several months the the patient was seen for multiple fall.    Patient was admitted to [facility] on 01/27/2015 for agitation and altered mental status on top of his already declining entirety. According to his son, patient was combative and was cursing at the nurses which has never happened before. On arrival to [facility], urinalysis was concerning for UTI. BNP 1452. Cr 1.26. Alk phos 359. CXR showed pulm vascular congestion with small bilateral pleural effusions. EKG showed a-fib with RVR. Trop gradually trended up to 0.35. D-dimer 1.89. Abd U/S was obtained given elevated transaminase which showed hepatic steatosis and bilateral pleural effusion. Echo showed 25-30%, severe diffuse hypokinesis with regional variation, akinesis of inferior and inferoseptal myocardium, mild AR, PA preak pressure 50mHg. Cardiology consulted for a-fib with RVR.    Inpatient Medications  . albuterol  2.5 mg Nebulization BID  . aspirin  81 mg Oral Daily  . cefTRIAXone (ROCEPHIN)  IV  1 g Intravenous  Q24H  . [START ON 01/28/2015] Chlorhexidine Gluconate Cloth  6 each Topical Q0600  . donepezil  10 mg Oral QHS  . dutasteride  0.5 mg Oral QHS  . enoxaparin (LOVENOX) injection  30 mg Subcutaneous Q24H  . feeding supplement (ENSURE ENLIVE)  237 mL Oral BID  . furosemide  40 mg Intravenous Once  . [START ON 01/28/2015] furosemide  40 mg Oral BID  . mupirocin ointment  1 application Nasal BID  . polyethylene glycol  17 g Oral Daily  . potassium chloride SA  20 mEq Oral Daily  .  sodium chloride  3 mL Intravenous Q12H  . sodium chloride  3 mL Intravenous Q12H    Family History Family History  Problem Relation Age of Onset  . Heart attack Father     died in his 45s from MI, heavy smoker  . Heart attack Mother     died in her 23s from MI, heavy smoker     Social History History   Social History  . Marital Status: Widowed    Spouse Name: N/A  . Number of Children: N/A  . Years of Education: N/A   Occupational History  . Not on file.   Social History Main Topics  . Smoking status: Never Smoker   . Smokeless tobacco: Not on file  . Alcohol Use: No     Comment: occasionally  . Drug Use: No  . Sexual Activity: Not on file   Other Topics Concern  . Not on file   Social History Narrative     Review of Systems  General:  No chills, fever, night sweats or weight changes.  Cardiovascular:  No chest pain, dyspnea on exertion, edema, orthopnea, palpitations, paroxysmal nocturnal dyspnea. Dermatological: No rash, lesions/masses Respiratory: No cough, dyspnea Urologic: No hematuria, dysuria Abdominal:   No nausea, vomiting, diarrhea, bright red blood per rectum, melena, or hematemesis Neurologic:  No visual changes, wkns, changes in mental status. All other systems reviewed and are otherwise negative except as noted above.  Physical Exam  Blood pressure 107/70, pulse 48, temperature 97.5 F (36.4 C), temperature source Axillary, resp. rate 17, height 5' 10.5" (1.791 m), weight 154 lb 1.6 oz (69.899 kg), SpO2 98 %.  General: NAD Psych: Dementia Neuro: Alert to name and person. Think today is 1933, does not know where he is. Moves all extremities spontaneously. HEENT: Normal  Neck: Supple without bruits. +JVD Lungs:  Diminished in bilateral bases.  Heart: Distant heart sounds. Irregular. no s3, s4, or murmurs appreciated. Abdomen: Soft, non-tender, non-distended, BS + x 4.  Extremities: Pitting Edema bilaterally LEs, R>L. Distal pulses  intact.  Labs   Recent Labs  01/27/15 0630 01/27/15 1050  TROPONINI 0.17* 0.35*   Lab Results  Component Value Date   WBC 8.8 01/27/2015   HGB 16.0 01/27/2015   HCT 46.8 01/27/2015   MCV 103.1* 01/27/2015   PLT 144* 01/27/2015     Recent Labs Lab 01/27/15 0010  NA 137  K 4.8  CL 100*  CO2 25  BUN 33*  CREATININE 1.26*  CALCIUM 8.7*  PROT 6.4*  BILITOT 1.5*  ALKPHOS 359*  ALT 52  AST 85*  GLUCOSE 118*    Lab Results  Component Value Date   DDIMER 1.89* 01/27/2015    Radiology/Studies  Dg Chest 2 View  01/27/2015   CLINICAL DATA:  Shortness of breath.  EXAM: CHEST  2 VIEW  COMPARISON:  06/27/2014  FINDINGS: Borderline heart size and pulmonary vascularity. Small  bilateral pleural effusions with basilar atelectasis. No airspace disease. No pneumothorax. Calcified and tortuous aorta. Degenerative changes in the spine and shoulders. Calcified lymph nodes in the right hilum.  IMPRESSION: Borderline cardiac enlargement and pulmonary vascular congestion with small bilateral pleural effusions and basilar atelectasis.   Electronically Signed   By: Lucienne Capers M.D.   On: 01/27/2015 00:59   US Abdomen Complete  01/27/2015   CLINICAL DATA:  Abnormal liver function tests. History of hypertension, CHF.  EXAM: ULTRASOUND ABDOMEN COMPLETE  COMPARISON:  None.  FINDINGS: Gallbladder: No gallstones or wall thickening visualized. No sonographic Murphy sign noted.  Common bile duct: Diameter: 5 mm  Liver: Diffusely echogenic without intrahepatic biliary dilatation. Hepatopetal portal vein.  IVC: No abnormality visualized.  Pancreas: Visualized portion unremarkable.  Spleen: Size and appearance within normal limits.  Right Kidney: Length: 10.6 cm. Echogenicity within normal limits. No mass or hydronephrosis visualized.  Left Kidney: Length: 11.5 cm. Echogenicity within normal limits. No mass or hydronephrosis visualized.  Abdominal aorta: No aneurysm visualized though infrarenal aorta  predominately obscured, likely by bowel gas.  Other findings: Small bilateral pleural effusions.  IMPRESSION: Hepatic steatosis.  No acute intra-abdominal process.  Partially imaged bilateral pleural effusions.   Electronically Signed   By: Elon Alas   On: 01/27/2015 04:15    ECG 01/27/15 Afib with RVR @ rate of 110. Some TWI in lateral leads  ASSESSMENT AND PLAN   1. Atrial fibrillation with RVR  - History limited 2/2 patients Dementia  - This patients CHA2DS2-VASc Score and unadjusted Ischemic Stroke Rate (% per year) is equal to 4.8 % stroke rate/year from a score of 4 (1 point for CHF, HTN, and 2 points for Age > 75)  -Cont IV Cardizem for rate control, had h/o bradycardia and hypotension leading to recurrent fall on BB, will avoid. Likely changed to PO diltiazem once rate controlled.  -Not a candidate for systemic anticoagulation 2/2 Dementia and fall risk  2. Acute on chronic systolic CHF (congestive heart failure)  -EF 25-30% via Echo on 01/27/15. Down from EF 40-45% in July 2015  -s/p single dose IV lasix around midnight this morning, resumed on PO lasix. Still has some fluid overload based on physical exam, will give additional 70m IV lasix and start 447mBID PO lasix tomorrow   3. UTI  -On Rocephin  -Likely contributing to confusion.  4. Hypertension  -Lisinopril at home. Dose decreased to 2.10m38maily after admission last Oct. Well managed here in hospital.  5. Abnormal liver function  -ABD US [place] steatosis.   -May be 2/2 passive congestion reference #2  6. AKI (acute kidney injury)  -SCr 1.26 on 01/27/15. Will continue to monitor with diuresis  8. Elevated d-dimer: defer to IM for further eval  9. Elevated trop with decrease in EF  - unfortunately not a cath candidate despite having wall motion abnormality  - some TWI in lateral leads on EKG   Signed, MenAlmyra DeforestP 01/27/2015, 4:25 PM  Agree with assessment and plan as noted above.  The patient has severe  dementia.  I spoke with his son.  The family had been in the process of arranging for hospice.  This is appropriate. I suspect that the patient's deterioration of left ventricular systolic function may be secondary to tachycardia mediated cardiomyopathy.  The patient has not done well in the past on beta blockers.  He may do better on low-dose diltiazem for rate control even given the fact that his  LV systolic function is already low.  He is not a candidate for long-term anticoagulation.  The patient has mild audible expiratory wheezes and elevated jugulovenous pressure this afternoon.  We will give additional IV Lasix tonight prior to switching to oral Lasix tomorrow.

## 2015-01-27 NOTE — Clinical Social Work Note (Signed)
Clinical Social Work Assessment  Patient Details  Name: Sean Carlson MRN: [id number] Date of Birth: October 20, 1925  Date of referral:  01/27/15               Reason for consult:  Facility Placement, Discharge Planning                Permission sought to share information with:  Oceanographeracility Contact Representative Permission granted to share information::  Yes, Verbal Permission Granted  Agency::  [facility]  Housing/Transportation Living arrangements for the past 2 months:  Assisted Living Facility Source of Information:  Adult Children, Facility Patient Interpreter Needed:  None Criminal Activity/Legal Involvement Pertinent to Current Situation/Hospitalization:  No - Comment as needed Significant Relationships:  Adult Children Lives with:  Facility Resident Do you feel safe going back to the place where you live?  Yes Need for family participation in patient care:  Yes (Comment)  Care giving concerns:  Pt admitted from ALF. Assessing potential return to facility at discharge.   Social Worker assessment / plan:  CSW spoke with pt son at bedside. Pt son confirmed pt admitted from facility and would like pt to return if possible at dc. [facility] and pt son understanding that PT will assess for return to ALF and [facility] will also review for appropriateness. Pt son did inform CSW that prior to admission, facility and family had started to discuss palliative care/hospice. He informed CSW that they have not had any goals of care conversations at this time and are just starting to pursue the need for this. Pt son unsure if he would want palliative care consult here at the hospital. He would like to speak with facility to see what they have initiated first.  He informed CSW after speaking with facility he will update CSW or pt nurse on decision for palliative care consult in hospital. Should he request palliative care consult in hospital, he is aware that there still may be  potential for this conversation not to take place here and may be facilitated at ALF; he expressed no issues with this.  CSW spoke with Tiffany at facility who informed CSW prior to admission pt primarily wheelchair bound. Pt able to feed himself but needs assistance with all other ADLS. They confirmed pt can dc back to facility at discharge pending review of pt dc paperwork.  Employment status:  Retired Database administratornsurance information:  Managed Medicare PT Recommendations:  Not assessed at this time Information / Referral to community resources:   (None needed at this time)  Patient/Family's Response to care:  Pt son cooperative through out discussion. Pt son hoping pt is able to return to [facility] at dc.  Patient/Family's Understanding of and Emotional Response to Diagnosis, Current Treatment, and Prognosis:  Pt son seems to have a good understanding of pt condition. Pt son does look to be overwhelmed/worried at this time.  Emotional Assessment Appearance:  Appears stated age Attitude/Demeanor/Rapport:  Unable to Assess Affect (typically observed):  Unable to Assess Orientation:  Oriented to Self Alcohol / Substance use:  Not Applicable Psych involvement (Current and /or in the community):  No (Comment)  Discharge Needs  Concerns to be addressed:  Discharge Planning Concerns Readmission within the last 30 days:  No Current discharge risk:  Dependent with Mobility, Cognitively Impaired, Chronically ill Barriers to Discharge:  Continued Medical Work up   [employer] BlockPoonum Glenis Musolf, Amgen IncLCSWA [phone]

## 2015-01-27 NOTE — Progress Notes (Addendum)
RN paged stating pt's HR was in the 80-100 range in Afib and Cardizem drip was due to be started. BP 107 systolic. Instead of drip, for now, will give one dose of Cardizem 5mg  IV load and watch. RN is to update this NP in about 30 mins.  Jimmye NormanKaren Kirby-Graham, NP [facility] Update-HR still over 100 at times and Afib. Will start Cardizem drip at 5mg /hr and NOT bolus again. BP holding. KJKG, NP

## 2015-01-27 NOTE — Progress Notes (Signed)
UR Completed Braelynn Lupton Graves-Bigelow, RN,BSN [phone]  

## 2015-01-27 NOTE — Progress Notes (Signed)
Echocardiogram 2D Echocardiogram with Definity has been performed.  Nolon RodBrown, Tony 01/27/2015, 1:10 PM

## 2015-01-27 NOTE — Progress Notes (Signed)
Lab notified me of critical lab value of troponin 0.53. Notified MD immediately afterwards.

## 2015-01-27 NOTE — Progress Notes (Signed)
[facility] PROGRESS NOTE   Sean Carlson WUJ:[id number] DOB: 12-22-1925 DOA: 01/26/2015 PCP: [name],[name], MD  HPI/Subjective: Seen with nurse at bedside, complaining about hurting everywhere. Patient is demented and confused.  Assessment/Plan: Active Problems:   CHF (congestive heart failure)   Atrial fibrillation with RVR   Hyperglycemia   Atrial fibrillation with rapid ventricular response Patient brought to the hospital because of wheezing and increased agitation. CXR showed bilateral pleural effusion in the ED, patient was satting 98% on room air. Placed on Cardizem drip, currently heart rate is controlled, last EKG showed A. fib, heart rate 84.  UTI Urinalysis consistent with UTI at the time of admission, started on Rocephin. Continue antibiotics and follow urine culture to adjust antibiotics accordingly.  Acute on chronic systolic CHF LVEF of 40-45%, presented with lower extremity edema, bilateral pleural effusion. He is on 40 mg of Lasix at home twice a day, switched to 40 mg IV, keep legs elevated. Watch for creatinine as it is in the high side.  Elevated troponin Troponin increasing consistently, patient was unable to mention if having chest pain. Present with troponin of 0.02, increased to 0.17 and now 0.35. Could be secondary to the rate and elevated creatinine. Cardiology to evaluate for both atrial fibrillation and elevated troponin.   Abnormal LFTs Abdominal ultrasound was negative for acute abnormalities. Patient has lower extremity edema, could be congestive hepatopathy.  Acute kidney injury Creatinine 1.26, baseline creatinine appears to be 1.0 in January 2016. Patient has lower extremity edema and bilateral pleural effusion, diuresis started, watch closely.  Code Status: DNR Family Communication: Plan discussed with the patient. Disposition Plan: Remains inpatient Diet: Diet NPO time  specified  Consultants:  None  Procedures:  None  Antibiotics:  None   Objective: Filed Vitals:   01/27/15 0540  BP: 107/70  Pulse: 48  Temp:   Resp: 17    Intake/Output Summary (Last 24 hours) at 01/27/15 1242 Last data filed at 01/27/15 1029  Gross per 24 hour  Intake      3 ml  Output     75 ml  Net    -72 ml   Filed Weights   01/27/15 0445  Weight: 69.899 kg (154 lb 1.6 oz)    Exam: General: Alert and awake, oriented x3, not in any acute distress. HEENT: anicteric sclera, pupils reactive to light and accommodation, EOMI CVS: S1-S2 clear, no murmur rubs or gallops Chest: clear to auscultation bilaterally, no wheezing, rales or rhonchi Abdomen: soft nontender, nondistended, normal bowel sounds, no organomegaly Extremities: +2 pedal edema bilaterally Neuro: Cranial nerves II-XII intact, no focal neurological deficits  Data Reviewed: Basic Metabolic Panel:  Recent Labs Lab 01/27/15 0010  NA 137  K 4.8  CL 100*  CO2 25  GLUCOSE 118*  BUN 33*  CREATININE 1.26*  CALCIUM 8.7*  MG 2.3   Liver Function Tests:  Recent Labs Lab 01/27/15 0010  AST 85*  ALT 52  ALKPHOS 359*  BILITOT 1.5*  PROT 6.4*  ALBUMIN 3.0*    Recent Labs Lab 01/27/15 0010  LIPASE 17*   No results for input(s): AMMONIA in the last 168 hours. CBC:  Recent Labs Lab 01/27/15 0010  WBC 8.8  NEUTROABS 4.4  HGB 16.0  HCT 46.8  MCV 103.1*  PLT 144*   Cardiac Enzymes:  Recent Labs Lab 01/27/15 0630 01/27/15 1050  TROPONINI 0.17* 0.35*   BNP (last 3 results)  Recent Labs  01/27/15 0010  BNP 1452.7*    ProBNP (  last 3 results)  Recent Labs  03/21/14 0430 03/22/14 0315 03/23/14 0230  PROBNP 2719.0* 2567.0* 2646.0*    CBG: No results for input(s): GLUCAP in the last 168 hours.  Micro Recent Results (from the past 240 hour(s))  MRSA PCR Screening     Status: Abnormal   Collection Time: 01/27/15  4:57 AM  Result Value Ref Range Status   MRSA by  PCR POSITIVE (A) NEGATIVE Final    Comment:        The GeneXpert MRSA Assay (FDA approved for NASAL specimens only), is one component of a comprehensive MRSA colonization surveillance program. It is not intended to diagnose MRSA infection nor to guide or monitor treatment for MRSA infections. RESULT CALLED TO, READ BACK BY AND VERIFIED WITH: A TILLMAN,RN AT [id number] 01/27/15 BY K BARR      Studies: Dg Chest 2 View  01/27/2015   CLINICAL DATA:  Shortness of breath.  EXAM: CHEST  2 VIEW  COMPARISON:  06/27/2014  FINDINGS: Borderline heart size and pulmonary vascularity. Small bilateral pleural effusions with basilar atelectasis. No airspace disease. No pneumothorax. Calcified and tortuous aorta. Degenerative changes in the spine and shoulders. Calcified lymph nodes in the right hilum.  IMPRESSION: Borderline cardiac enlargement and pulmonary vascular congestion with small bilateral pleural effusions and basilar atelectasis.   Electronically Signed   By: Burman NievesWilliam  Stevens M.D.   On: 01/27/2015 00:59   [place] Abdomen Complete  01/27/2015   CLINICAL DATA:  Abnormal liver function tests. History of hypertension, CHF.  EXAM: ULTRASOUND ABDOMEN COMPLETE  COMPARISON:  None.  FINDINGS: Gallbladder: No gallstones or wall thickening visualized. No sonographic Murphy sign noted.  Common bile duct: Diameter: 5 mm  Liver: Diffusely echogenic without intrahepatic biliary dilatation. Hepatopetal portal vein.  IVC: No abnormality visualized.  Pancreas: Visualized portion unremarkable.  Spleen: Size and appearance within normal limits.  Right Kidney: Length: 10.6 cm. Echogenicity within normal limits. No mass or hydronephrosis visualized.  Left Kidney: Length: 11.5 cm. Echogenicity within normal limits. No mass or hydronephrosis visualized.  Abdominal aorta: No aneurysm visualized though infrarenal aorta predominately obscured, likely by bowel gas.  Other findings: Small bilateral pleural effusions.  IMPRESSION: Hepatic  steatosis.  No acute intra-abdominal process.  Partially imaged bilateral pleural effusions.   Electronically Signed   By: Awilda Metroourtnay  Bloomer   On: 01/27/2015 04:15    Scheduled Meds: . albuterol  2.5 mg Nebulization BID  . aspirin  81 mg Oral Daily  . cefTRIAXone (ROCEPHIN)  IV  1 g Intravenous Q24H  . donepezil  10 mg Oral QHS  . dutasteride  0.5 mg Oral QHS  . enoxaparin (LOVENOX) injection  30 mg Subcutaneous Q24H  . feeding supplement (ENSURE ENLIVE)  237 mL Oral BID  . furosemide  40 mg Oral BID  . polyethylene glycol  17 g Oral Daily  . potassium chloride SA  20 mEq Oral Daily  . sodium chloride  3 mL Intravenous Q12H  . sodium chloride  3 mL Intravenous Q12H   Continuous Infusions: . diltiazem (CARDIZEM) infusion 5 mg/hr (01/27/15 0648)       Time spent: 35 minutes    Lifew[facility] HospitalELMAHI,Vinal Rosengrant A  [facility] Pager [phone] If 7PM-7AM, please contact night-coverage at [email or web address], password [email or web address] 01/27/2015, 12:42 PM  LOS: 0 days

## 2015-01-27 NOTE — H&P (Addendum)
Sean Carlson is an 81 y.o. male.    Ronald Polite (pcp)  Chief Complaint: ? dyspnea HPI: 81 yo male with CHF (EF 40-45%), Pafib , MR, AR, Dementia, is unable to give history.  Pt apparently per ED notes sent for agitation and ? Wheezing.  Pt was given solumedrol and lasix en route.  In ED CXR showed bil pleural effusions. Pox 98% on RA.   Pt was in Afib with RVR,  Pt will be admitted for Afib with rvr.    Past Medical History  Diagnosis Date  . Hypertension   . Dementia   . Osteoarthritis   . Compression fracture   . CHF (congestive heart failure)   . Atrial fibrillation     on ekg 06/28/2014  . Mitral regurgitation   . Aortic regurgitation     Past Surgical History  Procedure Laterality Date  . Appendectomy    . Back surgery      History reviewed. No pertinent family history. Social History:  reports that he has never smoked. He does not have any smokeless tobacco history on file. He reports that he does not drink alcohol or use illicit drugs.  Allergies: No Known Allergies Medications reviewed   (Not in a hospital admission)  Results for orders placed or performed during the hospital encounter of 01/26/15 (from the past 48 hour(s))  CBC with Differential/Platelet     Status: Abnormal   Collection Time: 01/27/15 12:10 AM  Result Value Ref Range   WBC 8.8 4.0 - 10.5 K/uL   RBC 4.54 4.22 - 5.81 MIL/uL   Hemoglobin 16.0 13.0 - 17.0 g/dL   HCT 46.8 39.0 - 52.0 %   MCV 103.1 (H) 78.0 - 100.0 fL   MCH 35.2 (H) 26.0 - 34.0 pg   MCHC 34.2 30.0 - 36.0 g/dL   RDW 14.0 11.5 - 15.5 %   Platelets 144 (L) 150 - 400 K/uL   Neutrophils Relative % 50 43 - 77 %   Neutro Abs 4.4 1.7 - 7.7 K/uL   Lymphocytes Relative 38 12 - 46 %   Lymphs Abs 3.4 0.7 - 4.0 K/uL   Monocytes Relative 10 3 - 12 %   Monocytes Absolute 0.8 0.1 - 1.0 K/uL   Eosinophils Relative 2 0 - 5 %   Eosinophils Absolute 0.2 0.0 - 0.7 K/uL   Basophils Relative 0 0 - 1 %   Basophils Absolute 0.0 0.0 - 0.1 K/uL   Comprehensive metabolic panel     Status: Abnormal   Collection Time: 01/27/15 12:10 AM  Result Value Ref Range   Sodium 137 135 - 145 mmol/L   Potassium 4.8 3.5 - 5.1 mmol/L   Chloride 100 (L) 101 - 111 mmol/L   CO2 25 22 - 32 mmol/L   Glucose, Bld 118 (H) 70 - 99 mg/dL   BUN 33 (H) 6 - 20 mg/dL   Creatinine, Ser 1.26 (H) 0.61 - 1.24 mg/dL   Calcium 8.7 (L) 8.9 - 10.3 mg/dL   Total Protein 6.4 (L) 6.5 - 8.1 g/dL   Albumin 3.0 (L) 3.5 - 5.0 g/dL   AST 85 (H) 15 - 41 U/L   ALT 52 17 - 63 U/L   Alkaline Phosphatase 359 (H) 38 - 126 U/L   Total Bilirubin 1.5 (H) 0.3 - 1.2 mg/dL   GFR calc non Af Amer 49 (L) >60 mL/min   GFR calc Af Amer 57 (L) >60 mL/min    Comment: (NOTE) The eGFR has  been calculated using the CKD EPI equation. This calculation has not been validated in all clinical situations. eGFR's persistently <60 mL/min signify possible Chronic Kidney Disease.    Anion gap 12 5 - 15  Lipase, blood     Status: Abnormal   Collection Time: 01/27/15 12:10 AM  Result Value Ref Range   Lipase 17 (L) 22 - 51 U/L  Protime-INR     Status: Abnormal   Collection Time: 01/27/15 12:10 AM  Result Value Ref Range   Prothrombin Time 17.8 (H) 11.6 - 15.2 seconds   INR 1.45 0.00 - 1.49  Brain natriuretic peptide     Status: Abnormal   Collection Time: 01/27/15 12:10 AM  Result Value Ref Range   B Natriuretic Peptide 1452.7 (H) 0.0 - 100.0 pg/mL  Magnesium     Status: None   Collection Time: 01/27/15 12:10 AM  Result Value Ref Range   Magnesium 2.3 1.7 - 2.4 mg/dL  I-stat troponin, ED     Status: None   Collection Time: 01/27/15 12:20 AM  Result Value Ref Range   Troponin i, poc 0.02 0.00 - 0.08 ng/mL   Comment 3            Comment: Due to the release kinetics of cTnI, a negative result within the first hours of the onset of symptoms does not rule out myocardial infarction with certainty. If myocardial infarction is still suspected, repeat the test at appropriate intervals.    I-Stat CG4 Lactic Acid, ED     Status: None   Collection Time: 01/27/15 12:22 AM  Result Value Ref Range   Lactic Acid, Venous 1.98 0.5 - 2.0 mmol/L  Urinalysis, Routine w reflex microscopic     Status: Abnormal   Collection Time: 01/27/15 12:24 AM  Result Value Ref Range   Color, Urine AMBER (A) YELLOW    Comment: BIOCHEMICALS MAY BE AFFECTED BY COLOR   APPearance CLOUDY (A) CLEAR   Specific Gravity, Urine 1.021 1.005 - 1.030   pH 6.5 5.0 - 8.0   Glucose, UA NEGATIVE NEGATIVE mg/dL   Hgb urine dipstick TRACE (A) NEGATIVE   Bilirubin Urine NEGATIVE NEGATIVE   Ketones, ur NEGATIVE NEGATIVE mg/dL   Protein, ur NEGATIVE NEGATIVE mg/dL   Urobilinogen, UA 1.0 0.0 - 1.0 mg/dL   Nitrite NEGATIVE NEGATIVE   Leukocytes, UA LARGE (A) NEGATIVE  I-Stat venous blood gas, ED     Status: Abnormal   Collection Time: 01/27/15 12:24 AM  Result Value Ref Range   pH, Ven 7.379 (H) 7.250 - 7.300   pCO2, Ven 42.3 (L) 45.0 - 50.0 mmHg   pO2, Ven 29.0 (LL) 30.0 - 45.0 mmHg   Bicarbonate 25.0 (H) 20.0 - 24.0 mEq/L   TCO2 26 0 - 100 mmol/L   O2 Saturation 53.0 %   Sample type VENOUS    Comment NOTIFIED PHYSICIAN   Urine microscopic-add on     Status: Abnormal   Collection Time: 01/27/15 12:24 AM  Result Value Ref Range   Squamous Epithelial / LPF RARE RARE   WBC, UA 21-50 <3 WBC/hpf   RBC / HPF 0-2 <3 RBC/hpf   Bacteria, UA MANY (A) RARE   Dg Chest 2 View  01/27/2015   CLINICAL DATA:  Shortness of breath.  EXAM: CHEST  2 VIEW  COMPARISON:  06/27/2014  FINDINGS: Borderline heart size and pulmonary vascularity. Small bilateral pleural effusions with basilar atelectasis. No airspace disease. No pneumothorax. Calcified and tortuous aorta. Degenerative changes in the spine  and shoulders. Calcified lymph nodes in the right hilum.  IMPRESSION: Borderline cardiac enlargement and pulmonary vascular congestion with small bilateral pleural effusions and basilar atelectasis.   Electronically Signed   By: Lucienne Capers M.D.   On: 01/27/2015 00:59    Review of Systems  Unable to perform ROS: dementia    Blood pressure 120/94, pulse 125, temperature 97.6 F (36.4 C), temperature source Rectal, resp. rate 22, SpO2 98 %. Physical Exam  Constitutional: He is oriented to person, place, and time. He appears well-developed and well-nourished.  HENT:  Head: Normocephalic and atraumatic.  Mouth/Throat: No oropharyngeal exudate.  Eyes: Conjunctivae and EOM are normal. Pupils are equal, round, and reactive to light. No scleral icterus.  Neck: Normal range of motion. Neck supple. JVD present. No tracheal deviation present. No thyromegaly present.  Cardiovascular: Exam reveals no gallop and no friction rub.   Murmur heard. Irr, irr, s1, s2,   Respiratory: Effort normal. No respiratory distress. He has no wheezes. He has rales. He exhibits no tenderness.  GI: Soft. Bowel sounds are normal. He exhibits no distension. There is no tenderness. There is no rebound and no guarding.  Musculoskeletal: Normal range of motion. He exhibits edema. He exhibits no tenderness.  1 + in bil lower ext  Lymphadenopathy:    He has no cervical adenopathy.  Neurological: He is alert and oriented to person, place, and time. He has normal reflexes. He displays normal reflexes. No cranial nerve deficit. He exhibits normal muscle tone. Coordination normal.  Skin: Skin is warm and dry. No rash noted. No erythema. No pallor.  Hammer toes, onychomycosis  Psychiatric: He has a normal mood and affect. His behavior is normal. Judgment and thought content normal.     Assessment/Plan Atrial fibrillation with RVR (CHADS2=4) Check trop i q6h x3 Check tsh Check cardiac echo No anticoagulation due to fall risk from dementia cardizem gtt  Hyperglycemia Check hga1c  CHF (EF 40-45%) With bilateral pleural effusions Cont lasix  Renal insufficiency Check cmp in am  Abnormal lft Check ultrasound Check ggt  Uti  Rocephin 1 gm  iv qday Blood culture x2 pending, urine culture pending  DVT prophylaxis scd, lovenox  Code status DNR per yellow DNR form  Critical care time 40 minutes  Maximilliano Kersh 01/27/2015, 2:28 AM

## 2015-01-27 NOTE — Care Management Note (Signed)
Case Management Note  Patient Details  Name: Sean Carlson MRN: [id number] Date of Birth: [birth year]  Subjective/Objective:      Pt admitted for SOB- Afib RVR. Positive UTI initiated on IV Rocephin.              Action/Plan: Pt is from [facility]. Will continue to monitor for disposition needs.    Expected Discharge Date:                  Expected Discharge Plan:  Assisted Living / Rest Home  In-House Referral:  Clinical Social Work  Discharge planning Services  CM Consult  Post Acute Care Choice:    Choice offered to:     DME Arranged:    DME Agency:     HH Arranged:    HH Agency:     Status of Service:     Medicare Important Message Given:    Date Medicare IM Given:    Medicare IM give by:    Date Additional Medicare IM Given:    Additional Medicare Important Message give by:     If discussed at Long Length of Stay Meetings, dates discussed:    Additional Comments:  [name], Eleora Sutherland Kaye, RN 01/27/2015, 1:24 PM

## 2015-01-28 DIAGNOSIS — N390 Urinary tract infection, site not specified: Secondary | ICD-10-CM | POA: Diagnosis present

## 2015-01-28 DIAGNOSIS — I10 Essential (primary) hypertension: Secondary | ICD-10-CM

## 2015-01-28 DIAGNOSIS — I5023 Acute on chronic systolic (congestive) heart failure: Secondary | ICD-10-CM

## 2015-01-28 DIAGNOSIS — E8779 Other fluid overload: Secondary | ICD-10-CM | POA: Insufficient documentation

## 2015-01-28 DIAGNOSIS — N179 Acute kidney failure, unspecified: Secondary | ICD-10-CM

## 2015-01-28 DIAGNOSIS — R0602 Shortness of breath: Secondary | ICD-10-CM | POA: Insufficient documentation

## 2015-01-28 LAB — HEMOGLOBIN A1C
Hgb A1c MFr Bld: 6 % — ABNORMAL HIGH (ref 4.8–5.6)
Mean Plasma Glucose: 126 mg/dL

## 2015-01-28 LAB — BASIC METABOLIC PANEL
Anion gap: 12 (ref 5–15)
BUN: 46 mg/dL — ABNORMAL HIGH (ref 6–20)
CHLORIDE: 100 mmol/L — AB (ref 101–111)
CO2: 23 mmol/L (ref 22–32)
Calcium: 8.8 mg/dL — ABNORMAL LOW (ref 8.9–10.3)
Creatinine, Ser: 1.62 mg/dL — ABNORMAL HIGH (ref 0.61–1.24)
GFR calc non Af Amer: 37 mL/min — ABNORMAL LOW (ref >60)
GFR, EST AFRICAN AMERICAN: 42 mL/min — AB (ref >60)
Glucose, Bld: 153 mg/dL — ABNORMAL HIGH (ref 65–99)
POTASSIUM: 5.1 mmol/L (ref 3.5–5.1)
Sodium: 135 mmol/L (ref 135–145)

## 2015-01-28 MED ORDER — DILTIAZEM HCL ER COATED BEADS 120 MG PO CP24
120.0000 mg | ORAL_CAPSULE | Freq: Every day | ORAL | Status: DC
  Administered 2015-01-28 – 2015-01-29 (×2): 120 mg via ORAL
  Filled 2015-01-28 (×2): qty 1

## 2015-01-28 MED ORDER — CEPHALEXIN 250 MG/5ML PO SUSR
250.0000 mg | Freq: Four times a day (QID) | ORAL | Status: DC
  Administered 2015-01-29 (×3): 250 mg via ORAL
  Filled 2015-01-28 (×6): qty 5

## 2015-01-28 MED ORDER — LEVALBUTEROL HCL 0.63 MG/3ML IN NEBU
0.6300 mg | INHALATION_SOLUTION | Freq: Four times a day (QID) | RESPIRATORY_TRACT | Status: DC | PRN
  Administered 2015-01-28: 0.63 mg via RESPIRATORY_TRACT
  Filled 2015-01-28: qty 3

## 2015-01-28 NOTE — Progress Notes (Signed)
CSW spoke to [place]issa- [facility] re: tentative d/c back to facility tomorrow with Palliative care. She stated that she has already spoke to patient's son and [facility] and they are prepared to accept patient back tomorrow or when stable.  All DME is in place per Melissa. She will need updated Fl2 (signed) and d/c summary to be faxed to facility prior to patient's move from the hospital. Fax # [phone]  CSW will assist with return to facility once d/c is confirmed per MD.  Lupita Leashonna T. Jaci LazierCrowder, KentuckyLCSW [phone]

## 2015-01-28 NOTE — Progress Notes (Signed)
Patient Name: Sean Carlson Date of Encounter: 01/28/2015  Principal Problem:   Atrial fibrillation with RVR Active Problems:   Hypertension   Hyperglycemia   Abnormal liver function   AKI (acute kidney injury)   Acute on chronic systolic CHF (congestive heart failure)    SUBJECTIVE  Pt is sleepy. Limited response to questions. Denies CP or palpation.   CURRENT MEDS . albuterol  2.5 mg Nebulization BID  . aspirin  81 mg Oral Daily  . cefTRIAXone (ROCEPHIN)  IV  1 g Intravenous Q24H  . Chlorhexidine Gluconate Cloth  6 each Topical Q0600  . donepezil  10 mg Oral QHS  . dutasteride  0.5 mg Oral QHS  . enoxaparin (LOVENOX) injection  30 mg Subcutaneous Q24H  . feeding supplement (ENSURE ENLIVE)  237 mL Oral BID  . furosemide  40 mg Oral BID  . mupirocin ointment  1 application Nasal BID  . polyethylene glycol  17 g Oral Daily  . potassium chloride SA  20 mEq Oral Daily  . sodium chloride  3 mL Intravenous Q12H  . sodium chloride  3 mL Intravenous Q12H    OBJECTIVE  Filed Vitals:   01/27/15 2031 01/28/15 0000 01/28/15 0735 01/28/15 0817  BP: 114/70 100/85  109/65  Pulse: 72 69    Temp: 97.5 F (36.4 C) 98.5 F (36.9 C)    TempSrc: Oral Oral    Resp: 23 18  21   Height:      Weight:      SpO2: 96% 98% 98% 97%    Intake/Output Summary (Last 24 hours) at 01/28/15 0959 Last data filed at 01/27/15 2300  Gross per 24 hour  Intake    323 ml  Output    400 ml  Net    -77 ml   Filed Weights   01/27/15 0445  Weight: 154 lb 1.6 oz (69.899 kg)    PHYSICAL EXAM  General: NAD. Sleepy.  Neuro: some what limited to response. Moves all extremities spontaneously. Psych: Dementia./  HEENT:  Normal  Neck: Supple without bruits. + JVD. Lungs:  Oxygen in place. Limited breathing effort. Diminished breath sound.  Heart: irregular. No murmur.  Abdomen: Soft, non-tender, non-distended, BS + x 4.  Extremities: No clubbing, cyanosis. 1-2 + pitting edema LE bilaterally.  DP/PT/Radials 2+ and equal bilaterally.  Accessory Clinical Findings  CBC  Recent Labs  01/27/15 0010  WBC 8.8  NEUTROABS 4.4  HGB 16.0  HCT 46.8  MCV 103.1*  PLT 144*   Basic Metabolic Panel  Recent Labs  01/27/15 0010  NA 137  K 4.8  CL 100*  CO2 25  GLUCOSE 118*  BUN 33*  CREATININE 1.26*  CALCIUM 8.7*  MG 2.3   Liver Function Tests  Recent Labs  01/27/15 0010  AST 85*  ALT 52  ALKPHOS 359*  BILITOT 1.5*  PROT 6.4*  ALBUMIN 3.0*    Recent Labs  01/27/15 0010  LIPASE 17*   Cardiac Enzymes  Recent Labs  01/27/15 0630 01/27/15 1050 01/27/15 1535  TROPONINI 0.17* 0.35* 0.53*    D-Dimer  Recent Labs  01/27/15 0630  DDIMER 1.89*   Hemoglobin A1C  Recent Labs  01/27/15 0630  HGBA1C 6.0*    Thyroid Function Tests  Recent Labs  01/27/15 0630  TSH 1.209    TELE  A. Fib with rate 60-70s.   Radiology/Studies  Echo 01/27/15 - Study Conclusions  - Left ventricle: The cavity size was normal. Wall thickness was normal. Systolic  function was severely reduced. The estimated ejection fraction was in the range of 25% to 30%. Severe diffuse hypokinesis with regional variations. Akinesis of the inferior and inferoseptal myocardium. Severe hypokinesis of the apical myocardium. Doppler parameters are consistent with elevated mean left atrial filling pressure. - Aortic valve: Transvalvular velocity was increased less than expected, due to low cardiac output. There was mild stenosis. There was mild regurgitation. Valve area (VTI): 1.72 cm^2. Valve area (Vmax): 2.09 cm^2. Valve area (Vmean): 1.77 cm^2. - Mitral valve: Calcified annulus. - Left atrium: The atrium was severely dilated. - Right ventricle: The cavity size was mildly dilated. Systolic function was moderately reduced. - Right atrium: The atrium was severely dilated. - Pulmonary arteries: Systolic pressure was mildly to moderately increased. PA peak  pressure: 49 mm Hg (S). - Pericardium, extracardiac: A trivial pericardial effusion was identified. There was a left pleural effusion.  Dg Chest 2 View  01/27/2015   CLINICAL DATA:  Shortness of breath.  EXAM: CHEST  2 VIEW  COMPARISON:  06/27/2014  FINDINGS: Borderline heart size and pulmonary vascularity. Small bilateral pleural effusions with basilar atelectasis. No airspace disease. No pneumothorax. Calcified and tortuous aorta. Degenerative changes in the spine and shoulders. Calcified lymph nodes in the right hilum.  IMPRESSION: Borderline cardiac enlargement and pulmonary vascular congestion with small bilateral pleural effusions and basilar atelectasis.   Electronically Signed   By: Burman Nieves M.D.   On: 01/27/2015 00:59   US Abdomen Complete  01/27/2015   CLINICAL DATA:  Abnormal liver function tests. History of hypertension, CHF.  EXAM: ULTRASOUND ABDOMEN COMPLETE  COMPARISON:  None.  FINDINGS: Gallbladder: No gallstones or wall thickening visualized. No sonographic Murphy sign noted.  Common bile duct: Diameter: 5 mm  Liver: Diffusely echogenic without intrahepatic biliary dilatation. Hepatopetal portal vein.  IVC: No abnormality visualized.  Pancreas: Visualized portion unremarkable.  Spleen: Size and appearance within normal limits.  Right Kidney: Length: 10.6 cm. Echogenicity within normal limits. No mass or hydronephrosis visualized.  Left Kidney: Length: 11.5 cm. Echogenicity within normal limits. No mass or hydronephrosis visualized.  Abdominal aorta: No aneurysm visualized though infrarenal aorta predominately obscured, likely by bowel gas.  Other findings: Small bilateral pleural effusions.  IMPRESSION: Hepatic steatosis.  No acute intra-abdominal process.  Partially imaged bilateral pleural effusions.   Electronically Signed   By: Awilda Metro   On: 01/27/2015 04:15    ASSESSMENT AND PLAN   1. Atrial fibrillation with RVR - This patients CHA2DS2-VASc Score  and unadjusted Ischemic Stroke Rate (% per year) is equal to 4.8 % stroke rate/year from a score of 4 (1 point for CHF, HTN, and 2 points for Age > 75) -On IV Cardizem for rate control, had h/o bradycardia and hypotension leading to recurrent fall on BB, will avoid. Rate well           controlled to 60-70s. Will switch to PO Cardizem CD . -Not a candidate for systemic anticoagulation due to Dementia and fall risk  2. Acute on chronic systolic CHF (congestive heart failure) -EF 25-30% via Echo on 01/27/15. Down from EF 40-45% in July 2015 -Given  IV lasix X 2, now on  BID PO lasix   3. UTI -On Rocephin -Likely contributing to confusion.  4. Hypertension -Lisinopril at home. Dose decreased to 2.5mg  daily after admission last Oct. Well managed here in hospital.  5. Abnormal liver function -ABD US shows steatosis.  -May be due to  passive congestion reference  - Per IM  6. AKI (acute kidney injury) -SCr 1.26 on 01/27/15. No lab done this AM. Will get BMET.   8. Elevated d-dimer: defer to IM for further eval  9. Elevated trop with decrease in EF - Unfortunately not a cath candidate despite having wall motion abnormality - some TWI in lateral leads on EKG  - Trop 0.17 ?0.35 ?0.53  -TSH normal   Dispo: Nurse notified me that there is a family discussion later today regarding care plan and palliative/hospice involvement.   Signed, Bhagat,Bhavinkumar PA-C Pager [phone]  I have examined the patient and reviewed assessment and plan and discussed with patient.  Agree with above as stated.  Would favor conservative management in this case given the patient's dementia and other comorbidities. He is adequately rate controlled at this time. He is not a candidate for anticoagulation. Can change furosemide to oral. Will change  diltiazem to oral.  Aliah Eriksson S.

## 2015-01-28 NOTE — Progress Notes (Signed)
[facility] PROGRESS NOTE   Sean FillersDonald Carlson UEA:[id number] DOB: 30-Jun-1926 DOA: 01/26/2015 PCP: [name],[name], MD  HPI/Subjective: Spoke with the son over the phone, patient supposed to be seen by hospice in the assisted living facility. Hospice to evaluate patient in the hospital.  Assessment/Plan: Principal Problem:   Atrial fibrillation with RVR Active Problems:   Hypertension   Hyperglycemia   Abnormal liver function   AKI (acute kidney injury)   Acute on chronic systolic CHF (congestive heart failure)   UTI (urinary tract infection)   Atrial fibrillation with rapid ventricular response Patient brought to the hospital because of wheezing and increased agitation. CXR showed bilateral pleural effusion in the ED, patient was satting 98% on room air. CHA2DS2-VASc score of 4, not candidate for anticoagulation because of dementia/risk of fall. Cardizem drip discontinued, started on Cardizem CD 120 mg  UTI Urinalysis consistent with UTI at the time of admission, started on Rocephin. Continue antibiotics and follow urine culture to adjust antibiotics accordingly.  Acute on chronic systolic CHF LVEF of 40-45%, presented with lower extremity edema, bilateral pleural effusion. He is on 40 mg of Lasix at home twice a day, switched to 40 mg IV, keep legs elevated. Creatinine worsened since yesterday, likely third spacing (has low albumin)  Elevated troponin Troponin increasing consistently, patient was unable to mention if having chest pain. Present with troponin of 0.02, increased to 0.17 and now 0.35. Could be secondary to the rate and elevated creatinine. Treated medically, not a candidate for cardiac catheterization.   Abnormal LFTs Abdominal ultrasound was negative for acute abnormalities. Patient has lower extremity edema, could be congestive hepatopathy.  Acute kidney injury Creatinine 1.26, baseline creatinine appears to be 1.0 in January 2016. Patient has lower  extremity edema and bilateral pleural effusion, diuresis started, watch closely.  Elevated d-dimer Done in the ED, positive at 1.89. We'll defer further testing. Unfortunately patient is not a candidate for anticoagulation if further studies positive for VTE.  End of life discussion Had a long discussion with patient's son Mr. Sean Carlson, per him hospice was supposed to be evaluating the patient. Hospital school to evaluate the patient likely to be discharged back to nursing home/L (a.m.  Code Status: DNR Family Communication: Plan discussed with the patient. Disposition Plan: Remains inpatient Diet: DIET SOFT Room service appropriate?: Yes; Fluid consistency:: Nectar Thick  Consultants:  None  Procedures:  None  Antibiotics:  None   Objective: Filed Vitals:   01/28/15 0817  BP: 109/65  Pulse:   Temp:   Resp: 21    Intake/Output Summary (Last 24 hours) at 01/28/15 1449 Last data filed at 01/27/15 2300  Gross per 24 hour  Intake    160 ml  Output    400 ml  Net   -240 ml   Filed Weights   01/27/15 0445  Weight: 69.899 kg (154 lb 1.6 oz)    Exam: General: Alert and awake, oriented x3, not in any acute distress. HEENT: anicteric sclera, pupils reactive to light and accommodation, EOMI CVS: S1-S2 clear, no murmur rubs or gallops Chest: clear to auscultation bilaterally, no wheezing, rales or rhonchi Abdomen: soft nontender, nondistended, normal bowel sounds, no organomegaly Extremities: +2 pedal edema bilaterally Neuro: Cranial nerves II-XII intact, no focal neurological deficits  Data Reviewed: Basic Metabolic Panel:  Recent Labs Lab 01/27/15 0010 01/28/15 1215  NA 137 135  K 4.8 5.1  CL 100* 100*  CO2 25 23  GLUCOSE 118* 153*  BUN 33* 46*  CREATININE  1.26* 1.62*  CALCIUM 8.7* 8.8*  MG 2.3  --    Liver Function Tests:  Recent Labs Lab 01/27/15 0010  AST 85*  ALT 52  ALKPHOS 359*  BILITOT 1.5*  PROT 6.4*  ALBUMIN 3.0*    Recent  Labs Lab 01/27/15 0010  LIPASE 17*   No results for input(s): AMMONIA in the last 168 hours. CBC:  Recent Labs Lab 01/27/15 0010  WBC 8.8  NEUTROABS 4.4  HGB 16.0  HCT 46.8  MCV 103.1*  PLT 144*   Cardiac Enzymes:  Recent Labs Lab 01/27/15 0630 01/27/15 1050 01/27/15 1535  TROPONINI 0.17* 0.35* 0.53*   BNP (last 3 results)  Recent Labs  01/27/15 0010  BNP 1452.7*    ProBNP (last 3 results)  Recent Labs  03/21/14 0430 03/22/14 0315 03/23/14 0230  PROBNP 2719.0* 2567.0* 2646.0*    CBG: No results for input(s): GLUCAP in the last 168 hours.  Micro Recent Results (from the past 240 hour(s))  MRSA PCR Screening     Status: Abnormal   Collection Time: 01/27/15  4:57 AM  Result Value Ref Range Status   MRSA by PCR POSITIVE (A) NEGATIVE Final    Comment:        The GeneXpert MRSA Assay (FDA approved for NASAL specimens only), is one component of a comprehensive MRSA colonization surveillance program. It is not intended to diagnose MRSA infection nor to guide or monitor treatment for MRSA infections. RESULT CALLED TO, READ BACK BY AND VERIFIED WITH: A TILLMAN,RN AT [id number] 01/27/15 BY K BARR      Studies: Dg Chest 2 View  01/27/2015   CLINICAL DATA:  Shortness of breath.  EXAM: CHEST  2 VIEW  COMPARISON:  06/27/2014  FINDINGS: Borderline heart size and pulmonary vascularity. Small bilateral pleural effusions with basilar atelectasis. No airspace disease. No pneumothorax. Calcified and tortuous aorta. Degenerative changes in the spine and shoulders. Calcified lymph nodes in the right hilum.  IMPRESSION: Borderline cardiac enlargement and pulmonary vascular congestion with small bilateral pleural effusions and basilar atelectasis.   Electronically Signed   By: Burman NievesWilliam  Stevens M.D.   On: 01/27/2015 00:59   [place] Abdomen Complete  01/27/2015   CLINICAL DATA:  Abnormal liver function tests. History of hypertension, CHF.  EXAM: ULTRASOUND ABDOMEN COMPLETE   COMPARISON:  None.  FINDINGS: Gallbladder: No gallstones or wall thickening visualized. No sonographic Murphy sign noted.  Common bile duct: Diameter: 5 mm  Liver: Diffusely echogenic without intrahepatic biliary dilatation. Hepatopetal portal vein.  IVC: No abnormality visualized.  Pancreas: Visualized portion unremarkable.  Spleen: Size and appearance within normal limits.  Right Kidney: Length: 10.6 cm. Echogenicity within normal limits. No mass or hydronephrosis visualized.  Left Kidney: Length: 11.5 cm. Echogenicity within normal limits. No mass or hydronephrosis visualized.  Abdominal aorta: No aneurysm visualized though infrarenal aorta predominately obscured, likely by bowel gas.  Other findings: Small bilateral pleural effusions.  IMPRESSION: Hepatic steatosis.  No acute intra-abdominal process.  Partially imaged bilateral pleural effusions.   Electronically Signed   By: Awilda Metroourtnay  Bloomer   On: 01/27/2015 04:15    Scheduled Meds: . albuterol  2.5 mg Nebulization BID  . aspirin  81 mg Oral Daily  . cefTRIAXone (ROCEPHIN)  IV  1 g Intravenous Q24H  . Chlorhexidine Gluconate Cloth  6 each Topical Q0600  . diltiazem  120 mg Oral Daily  . donepezil  10 mg Oral QHS  . dutasteride  0.5 mg Oral QHS  . enoxaparin (LOVENOX) injection  30 mg Subcutaneous Q24H  . feeding supplement (ENSURE ENLIVE)  237 mL Oral BID  . furosemide  40 mg Oral BID  . mupirocin ointment  1 application Nasal BID  . polyethylene glycol  17 g Oral Daily  . potassium chloride SA  20 mEq Oral Daily  . sodium chloride  3 mL Intravenous Q12H  . sodium chloride  3 mL Intravenous Q12H   Continuous Infusions:       Time spent: 35 minutes    [facility] A  [facility] Pager [phone] If 7PM-7AM, please contact night-coverage at [email or web address], password [facility] 01/28/2015, 2:49 PM  LOS: 1 day

## 2015-01-29 DIAGNOSIS — N3000 Acute cystitis without hematuria: Secondary | ICD-10-CM

## 2015-01-29 DIAGNOSIS — E8770 Fluid overload, unspecified: Secondary | ICD-10-CM

## 2015-01-29 MED ORDER — ENOXAPARIN SODIUM 40 MG/0.4ML ~~LOC~~ SOLN: 40.0000 mg | SUBCUTANEOUS | Status: DC

## 2015-01-29 MED ORDER — MORPHINE SULFATE 10 MG/5ML PO SOLN: 2.5000 mg | Freq: Four times a day (QID) | ORAL | Status: AC | PRN

## 2015-01-29 MED ORDER — CEFUROXIME AXETIL 500 MG PO TABS: 500.0000 mg | ORAL_TABLET | Freq: Two times a day (BID) | ORAL | Status: AC

## 2015-01-29 MED ORDER — DILTIAZEM HCL 30 MG PO TABS: 30.0000 mg | ORAL_TABLET | Freq: Four times a day (QID) | ORAL | Status: AC

## 2015-01-29 MED ORDER — DILTIAZEM HCL 30 MG PO TABS: 30.0000 mg | ORAL_TABLET | Freq: Four times a day (QID) | ORAL | Status: DC

## 2015-01-29 MED ORDER — MORPHINE SULFATE 10 MG/5ML PO SOLN: 2.5000 mg | Freq: Four times a day (QID) | ORAL | Status: DC | PRN

## 2015-01-29 NOTE — Discharge Summary (Signed)
Physician Discharge Summary  Sean Carlson ZOX:[id number] DOB: Jul 16, 1926 DOA: 01/26/2015  PCP: [name],Sean D, MD  Admit date: 01/26/2015 Discharge date: 01/29/2015  Time spent: 40 minutes  Recommendations for Outpatient Follow-up:  1. Patient will discharge back to his assisted living facility, to be [facility] care. 2. Ceftin 500 mg by mouth twice a day for 3 more days. 3. Started on Roxanol 2.5-5 mg every 4 hours as needed for pain and shortness of breath  Discharge Diagnoses:  Principal Problem:   Atrial fibrillation with RVR Active Problems:   Hypertension   Hyperglycemia   Abnormal liver function   AKI (acute kidney injury)   Acute on chronic systolic CHF (congestive heart failure)   UTI (urinary tract infection)   Other hypervolemia   SOB (shortness of breath)   Urinary tract infectious disease   Discharge Condition: Fair  Diet recommendation: Mechanical soft diet with nectar thick liquids  Filed Weights   01/27/15 0445 01/29/15 0433  Weight: 69.899 kg (154 lb 1.6 oz) 70.217 kg (154 lb 12.8 oz)    History of present illness:  81 yo male with CHF (EF 40-45%), Pafib , MR, AR, Dementia, is unable to give history. Pt apparently per ED notes sent for agitation and ? Wheezing. Pt was given solumedrol and lasix en route. In ED CXR showed bil pleural effusions. Pox 98% on RA. Pt was in Afib with RVR, Pt will be admitted for Afib with rvr.   Hospital Course:   Atrial fibrillation with rapid ventricular response Patient brought to the hospital because of wheezing and increased agitation. CXR showed bilateral pleural effusion in the ED, patient was satting 98% on room air. CHA2DS2-VASc score of 4, not candidate for anticoagulation because of dementia/risk of fall. Cardizem drip discontinued, started on Cardizem CD 120 mg, not able to swallow so switched to 30 mg every 6 hours.  UTI Urinalysis consistent with UTI at the time of admission, started on  Rocephin. Discharge on Ceftin for 3 more days to complete 5 days of antibiotics.  Acute on chronic systolic CHF LVEF of 40-45%, presented with lower extremity edema, bilateral pleural effusion. He is on 40 mg of Lasix at home twice a day, switched to 40 mg IV, keep legs elevated. Creatinine worsened since yesterday, likely third spacing (has low albumin)  Elevated troponin Troponin increasing consistently, patient was unable to mention if having chest pain. Present with troponin of 0.02, increased to 0.17 and now 0.35. Could be secondary to the rate and elevated creatinine. Treated medically, not a candidate for cardiac catheterization.  Abnormal LFTs Abdominal ultrasound was negative for acute abnormalities. Patient has lower extremity edema, could be congestive hepatopathy.  Acute kidney injury Creatinine 1.26, baseline creatinine appears to be 1.0 in January 2016. Patient has lower extremity edema and bilateral pleural effusion, diuresis started, watch closely.  Elevated d-dimer Done in the ED, positive at 1.89. We'll defer further testing. Unfortunately patient is not a candidate for anticoagulation if further studies positive for VTE.  End of life discussion Had a long discussion with patient's son Mr. Sean Carlson, interested in hospice. Patient was supposed to be seen by outpatient hospice care agency, they came and evaluated the patient in the hospital. Patient to be transferred back to his assisted living facility with hospice care. DNR/DNI.   Procedures:  None  Consultations:  Cardiology  Discharge Exam: Filed Vitals:   01/29/15 0934  BP: 113/65  Pulse:   Temp:   Resp:  General: Alert and awake, oriented x3, not in any acute distress. HEENT: anicteric sclera, pupils reactive to light and accommodation, EOMI CVS: S1-S2 clear, no murmur rubs or gallops Chest: clear to auscultation bilaterally, no wheezing, rales or rhonchi Abdomen: soft nontender,  nondistended, normal bowel sounds, no organomegaly Extremities: no cyanosis, clubbing or edema noted bilaterally Neuro: Cranial nerves II-XII intact, no focal neurological deficits  Discharge Instructions   Discharge Instructions    Diet - low sodium heart healthy    Complete by:  As directed      Increase activity slowly    Complete by:  As directed           Current Discharge Medication List    START taking these medications   Details  cefUROXime (CEFTIN) 500 MG tablet Take 1 tablet (500 mg total) by mouth 2 (two) times daily with a meal.    diltiazem (CARDIZEM) 30 MG tablet Take 1 tablet (30 mg total) by mouth 4 (four) times daily. Qty: 120 tablet, Refills: 0    morphine 10 MG/5ML solution Take 1.3-2.5 mLs (2.6-5 mg total) by mouth every 6 (six) hours as needed for severe pain. Qty: 15 mL, Refills: 0      CONTINUE these medications which have NOT CHANGED   Details  albuterol (PROVENTIL) (2.5 MG/3ML) 0.083% nebulizer solution Take 2.5 mg by nebulization 2 (two) times daily.    aspirin 81 MG chewable tablet Chew 81 mg by mouth daily.    donepezil (ARICEPT) 10 MG tablet Take 10 mg by mouth at bedtime.    dutasteride (AVODART) 0.5 MG capsule Take 1 capsule (0.5 mg total) by mouth at bedtime. Qty: 30 capsule, Refills: 0    feeding supplement (ENSURE IMMUNE HEALTH) LIQD Take 237 mLs by mouth 2 (two) times daily.    furosemide (LASIX) 40 MG tablet Take 1 tablet (40 mg total) by mouth 2 (two) times daily. Qty: 60 tablet, Refills: 0    polyethylene glycol (MIRALAX / GLYCOLAX) packet Take 17 g by mouth daily.    potassium chloride SA (K-DUR,KLOR-CON) 20 MEQ tablet Take 1 tablet (20 mEq total) by mouth daily. Qty: 30 tablet, Refills: 0    ZINC OXIDE EX Apply 1 application topically as needed (apply to affected area of buttocks).    lisinopril (PRINIVIL,ZESTRIL) 2.5 MG tablet Take 1 tablet (2.5 mg total) by mouth daily. Qty: 30 tablet, Refills: 0      STOP taking these  medications     HYDROcodone-acetaminophen (NORCO/VICODIN) 5-325 MG per tablet      HYDROcodone-acetaminophen (NORCO/VICODIN) 5-325 MG per tablet      loperamide (IMODIUM) 2 MG capsule      meloxicam (MOBIC) 15 MG tablet        No Known Allergies Follow-up Information    Follow up with [facility].   Why:  Hospice Registered Nurse.    Contact information:   [address], [address]  [place], [place] [address] [phone]       The results of significant diagnostics from this hospitalization (including imaging, microbiology, ancillary and laboratory) are listed below for reference.    Significant Diagnostic Studies: Dg Chest 2 View  01/27/2015   CLINICAL DATA:  Shortness of breath.  EXAM: CHEST  2 VIEW  COMPARISON:  06/27/2014  FINDINGS: Borderline heart size and pulmonary vascularity. Small bilateral pleural effusions with basilar atelectasis. No airspace disease. No pneumothorax. Calcified and tortuous aorta. Degenerative changes in the spine and shoulders. Calcified lymph nodes in the right hilum.  IMPRESSION: Borderline cardiac enlargement and pulmonary vascular congestion with small bilateral pleural effusions and basilar atelectasis.   Electronically Signed   By: Burman Nieves M.D.   On: 01/27/2015 00:59   US Abdomen Complete  01/27/2015   CLINICAL DATA:  Abnormal liver function tests. History of hypertension, CHF.  EXAM: ULTRASOUND ABDOMEN COMPLETE  COMPARISON:  None.  FINDINGS: Gallbladder: No gallstones or wall thickening visualized. No sonographic Murphy sign noted.  Common bile duct: Diameter: 5 mm  Liver: Diffusely echogenic without intrahepatic biliary dilatation. Hepatopetal portal vein.  IVC: No abnormality visualized.  Pancreas: Visualized portion unremarkable.  Spleen: Size and appearance within normal limits.  Right Kidney: Length: 10.6 cm. Echogenicity within normal limits. No mass or hydronephrosis visualized.  Left Kidney: Length: 11.5 cm. Echogenicity  within normal limits. No mass or hydronephrosis visualized.  Abdominal aorta: No aneurysm visualized though infrarenal aorta predominately obscured, likely by bowel gas.  Other findings: Small bilateral pleural effusions.  IMPRESSION: Hepatic steatosis.  No acute intra-abdominal process.  Partially imaged bilateral pleural effusions.   Electronically Signed   By: Awilda Metro   On: 01/27/2015 04:15    Microbiology: Recent Results (from the past 240 hour(s))  MRSA PCR Screening     Status: Abnormal   Collection Time: 01/27/15  4:57 AM  Result Value Ref Range Status   MRSA by PCR POSITIVE (A) NEGATIVE Final    Comment:        The GeneXpert MRSA Assay (FDA approved for NASAL specimens only), is one component of a comprehensive MRSA colonization surveillance program. It is not intended to diagnose MRSA infection nor to guide or monitor treatment for MRSA infections. RESULT CALLED TO, READ BACK BY AND VERIFIED WITH: A [facility] AT 1610 01/27/15 BY K BARR      Labs: Basic Metabolic Panel:  Recent Labs Lab 01/27/15 0010 01/28/15 1215  NA 137 135  K 4.8 5.1  CL 100* 100*  CO2 25 23  GLUCOSE 118* 153*  BUN 33* 46*  CREATININE 1.26* 1.62*  CALCIUM 8.7* 8.8*  MG 2.3  --    Liver Function Tests:  Recent Labs Lab 01/27/15 0010  AST 85*  ALT 52  ALKPHOS 359*  BILITOT 1.5*  PROT 6.4*  ALBUMIN 3.0*    Recent Labs Lab 01/27/15 0010  LIPASE 17*   No results for input(s): AMMONIA in the last 168 hours. CBC:  Recent Labs Lab 01/27/15 0010  WBC 8.8  NEUTROABS 4.4  HGB 16.0  HCT 46.8  MCV 103.1*  PLT 144*   Cardiac Enzymes:  Recent Labs Lab 01/27/15 0630 01/27/15 1050 01/27/15 1535  TROPONINI 0.17* 0.35* 0.53*   BNP: BNP (last 3 results)  Recent Labs  01/27/15 0010  BNP 1452.7*    ProBNP (last 3 results)  Recent Labs  03/21/14 0430 03/22/14 0315 03/23/14 0230  PROBNP 2719.0* 2567.0* 2646.0*    CBG: No results for input(s): GLUCAP in  the last 168 hours.     Signed:  Cylan Borum A  [facility] 01/29/2015, 12:35 PM

## 2015-01-29 NOTE — Progress Notes (Signed)
CSW (Clinical Child psychotherapistocial Worker) notified by MD of plan for [facility] today. Facility aware. Awaiting dc summary to complete final FL2 with medications and fax to facility.  Kalan Yeley, LCSWA [phone]

## 2015-01-29 NOTE — Progress Notes (Signed)
Attempted to call report to [facility] facility.

## 2015-01-29 NOTE — Progress Notes (Signed)
CSW (Clinical Child psychotherapistocial Worker) spoke with pt med Hospital doctortech Michelle at facility. She confirmed pt dc paperwork has been reviewed and pt is okay to return. CSW prepared pt dc packet and placed with shadow chart. CSW arranged non-emergent ambulance. Pt, pt nurse, and facility aware. CSW signing off.  Jyll Tomaro, LCSWA [phone]

## 2015-01-29 NOTE — Progress Notes (Signed)
SUBJECTIVE:  No complaint.  Chewing all of his pills per the nurse, including Diltiazem CD.  OBJECTIVE:   Vitals:   Filed Vitals:   01/29/15 [id number] 01/29/15 0455 01/29/15 0727 01/29/15 0934  BP:  112/70  113/65  Pulse:  87    Temp: 97.2 F (36.2 C)     TempSrc: Oral     Resp:  13    Height:      Weight: 154 lb 12.8 oz (70.217 kg)     SpO2:  99% 96%    I&O's:   Intake/Output Summary (Last 24 hours) at 01/29/15 1003 Last data filed at 01/29/15 [id number]  Gross per 24 hour  Intake    240 ml  Output   1300 ml  Net  -1060 ml   TELEMETRY: Reviewed telemetry pt in AFib, rate controlled:     PHYSICAL EXAM General: Well developed, well nourished, in no acute distress Head:   Normal cephalic and atramatic  Lungs:  wheezing bilaterally Heart:   Irregularly irregular S1 S2  No JVD.   Abdomen: abdomen soft and non-tender Msk:  Back normal,  Normal strength and tone for age. Extremities:   Bilateral 1+_ edema   Neuro: Confused Psych:  Confused, but answers some questions appropriately Skin: No rash   LABS: Basic Metabolic Panel:  Recent Labs  54/05/8104/11/16 0010 01/28/15 1215  NA 137 135  K 4.8 5.1  CL 100* 100*  CO2 25 23  GLUCOSE 118* 153*  BUN 33* 46*  CREATININE 1.26* 1.62*  CALCIUM 8.7* 8.8*  MG 2.3  --    Liver Function Tests:  Recent Labs  01/27/15 0010  AST 85*  ALT 52  ALKPHOS 359*  BILITOT 1.5*  PROT 6.4*  ALBUMIN 3.0*    Recent Labs  01/27/15 0010  LIPASE 17*   CBC:  Recent Labs  01/27/15 0010  WBC 8.8  NEUTROABS 4.4  HGB 16.0  HCT 46.8  MCV 103.1*  PLT 144*   Cardiac Enzymes:  Recent Labs  01/27/15 0630 01/27/15 1050 01/27/15 1535  TROPONINI 0.17* 0.35* 0.53*   BNP: Invalid input(s): POCBNP D-Dimer:  Recent Labs  01/27/15 0630  DDIMER 1.89*   Hemoglobin A1C:  Recent Labs  01/27/15 0630  HGBA1C 6.0*   Fasting Lipid Panel: No results for input(s): CHOL, HDL, LDLCALC, TRIG, CHOLHDL, LDLDIRECT in the last 72  hours. Thyroid Function Tests:  Recent Labs  01/27/15 0630  TSH 1.209   Anemia Panel: No results for input(s): VITAMINB12, FOLATE, FERRITIN, TIBC, IRON, RETICCTPCT in the last 72 hours. Coag Panel:   Lab Results  Component Value Date   INR 1.45 01/27/2015   INR 1.10 12/21/2010    RADIOLOGY: Dg Chest 2 View  01/27/2015   CLINICAL DATA:  Shortness of breath.  EXAM: CHEST  2 VIEW  COMPARISON:  06/27/2014  FINDINGS: Borderline heart size and pulmonary vascularity. Small bilateral pleural effusions with basilar atelectasis. No airspace disease. No pneumothorax. Calcified and tortuous aorta. Degenerative changes in the spine and shoulders. Calcified lymph nodes in the right hilum.  IMPRESSION: Borderline cardiac enlargement and pulmonary vascular congestion with small bilateral pleural effusions and basilar atelectasis.   Electronically Signed   By: Burman NievesWilliam  Stevens M.D.   On: 01/27/2015 00:59   [place] Abdomen Complete  01/27/2015   CLINICAL DATA:  Abnormal liver function tests. History of hypertension, CHF.  EXAM: ULTRASOUND ABDOMEN COMPLETE  COMPARISON:  None.  FINDINGS: Gallbladder: No gallstones or wall thickening visualized. No sonographic Murphy sign  noted.  Common bile duct: Diameter: 5 mm  Liver: Diffusely echogenic without intrahepatic biliary dilatation. Hepatopetal portal vein.  IVC: No abnormality visualized.  Pancreas: Visualized portion unremarkable.  Spleen: Size and appearance within normal limits.  Right Kidney: Length: 10.6 cm. Echogenicity within normal limits. No mass or hydronephrosis visualized.  Left Kidney: Length: 11.5 cm. Echogenicity within normal limits. No mass or hydronephrosis visualized.  Abdominal aorta: No aneurysm visualized though infrarenal aorta predominately obscured, likely by bowel gas.  Other findings: Small bilateral pleural effusions.  IMPRESSION: Hepatic steatosis.  No acute intra-abdominal process.  Partially imaged bilateral pleural effusions.    Electronically Signed   By: Awilda Metroourtnay  Bloomer   On: 01/27/2015 04:15      ASSESSMENT: AFib  PLAN:  Rate controlled, but since he chews the pills, Dilt CD will not be a good choice.  Will switch to short acting Cardizem, 30 mg 4x/day.    Continue oral diuretic for fluid overload.    I think Hospice care is very reasonable.  THis is to be discussed with palliative medicine.     Corky CraftsJayadeep S Renarda Mullinix, MD  01/29/2015  10:03 AM

## 2015-01-29 NOTE — Progress Notes (Signed)
Pt's IV came out. Np on call made aware.

## 2015-01-29 NOTE — Care Management Note (Signed)
Case Management Note  Patient Details  Name: Sean Carlson MRN: [id number] Date of Birth: [birth year]  Subjective/Objective:   Pt admitted for SOB- Afib RVR. Positive UTI initiated on IV Rocephin.                 Action/Plan: Plan for d/c to [facility] with Hospice Services via [facility]. HH orders faxed to [facility]. No further needs from CM at this time.    Expected Discharge Date:                  Expected Discharge Plan:  Assisted Living / Rest Home  In-House Referral:  Clinical Social Work  Discharge planning Services  CM Consult  Post Acute Care Choice:    Choice offered to:     DME Arranged:    DME Agency:     HH Arranged:    HH Agency:     Status of Service:     Medicare Important Message Given:    Date Medicare IM Given:    Medicare IM give by:    Date Additional Medicare IM Given:    Additional Medicare Important Message give by:     If discussed at Long Length of Stay Meetings, dates discussed:    Additional Comments:  [name], [name], RN 01/29/2015, 8:45 AM

## 2015-03-19 DEATH — deceased

## 2016-10-11 IMAGING — CR DG LUMBAR SPINE COMPLETE 4+V
5 series · 5 of 5 positions shown · non-contrast
Comparison: Lumbar MRI July 01, 2014

CLINICAL DATA: Back pain, progressive

EXAM:
LUMBAR SPINE - COMPLETE 4+ VIEW

[l-spine ap]
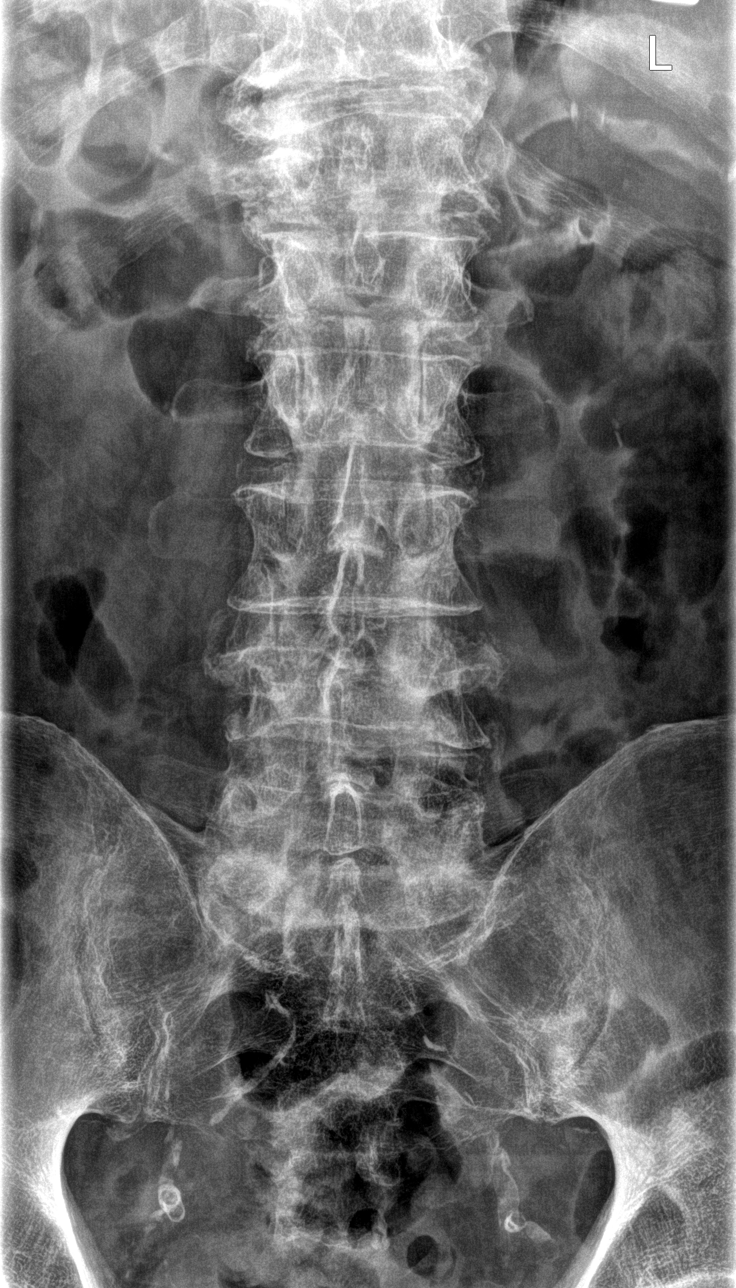

[l-spine obl (1 of 2)]
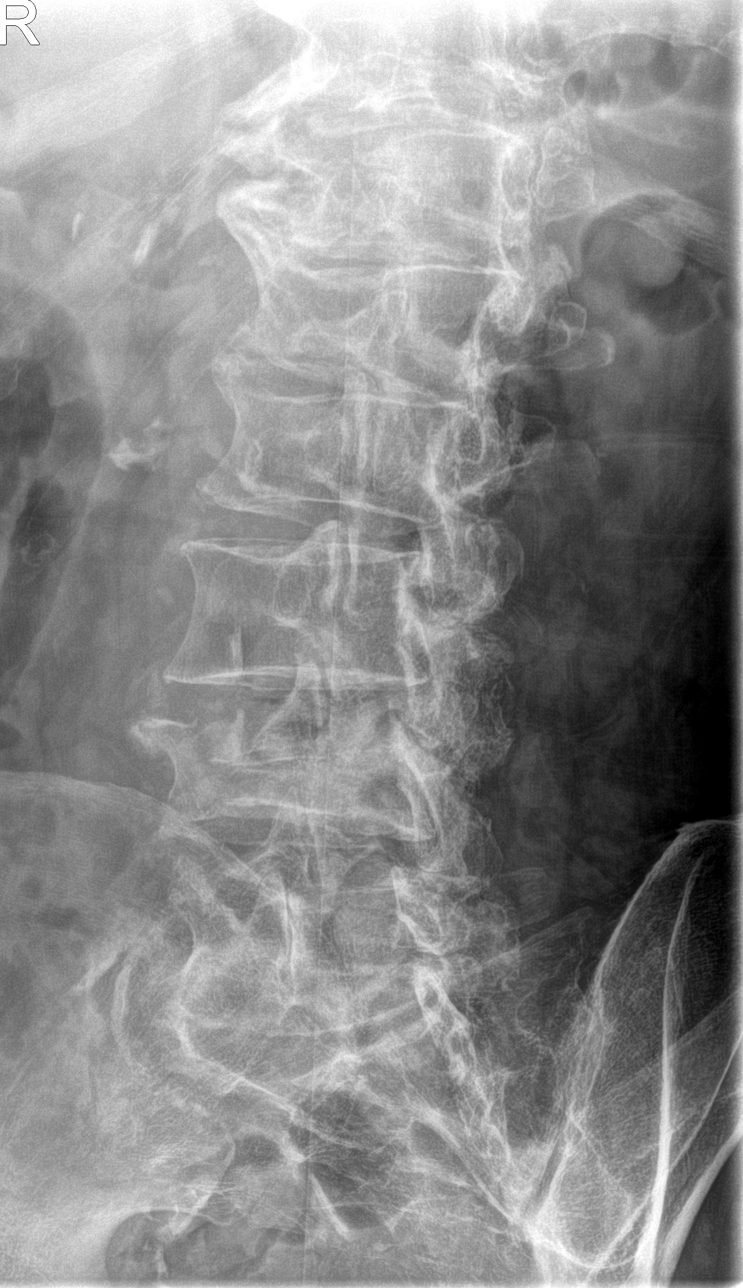

[l-spine obl (2 of 2)]
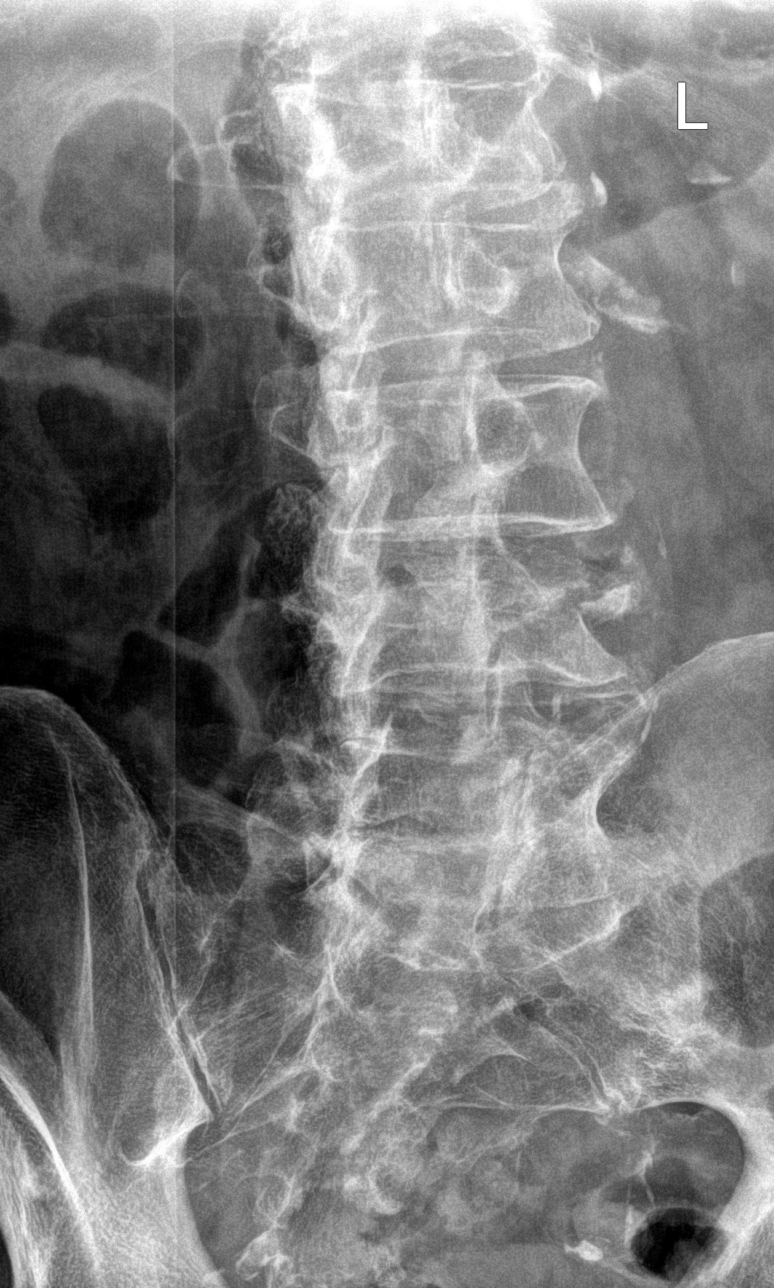

[l-spine lat]
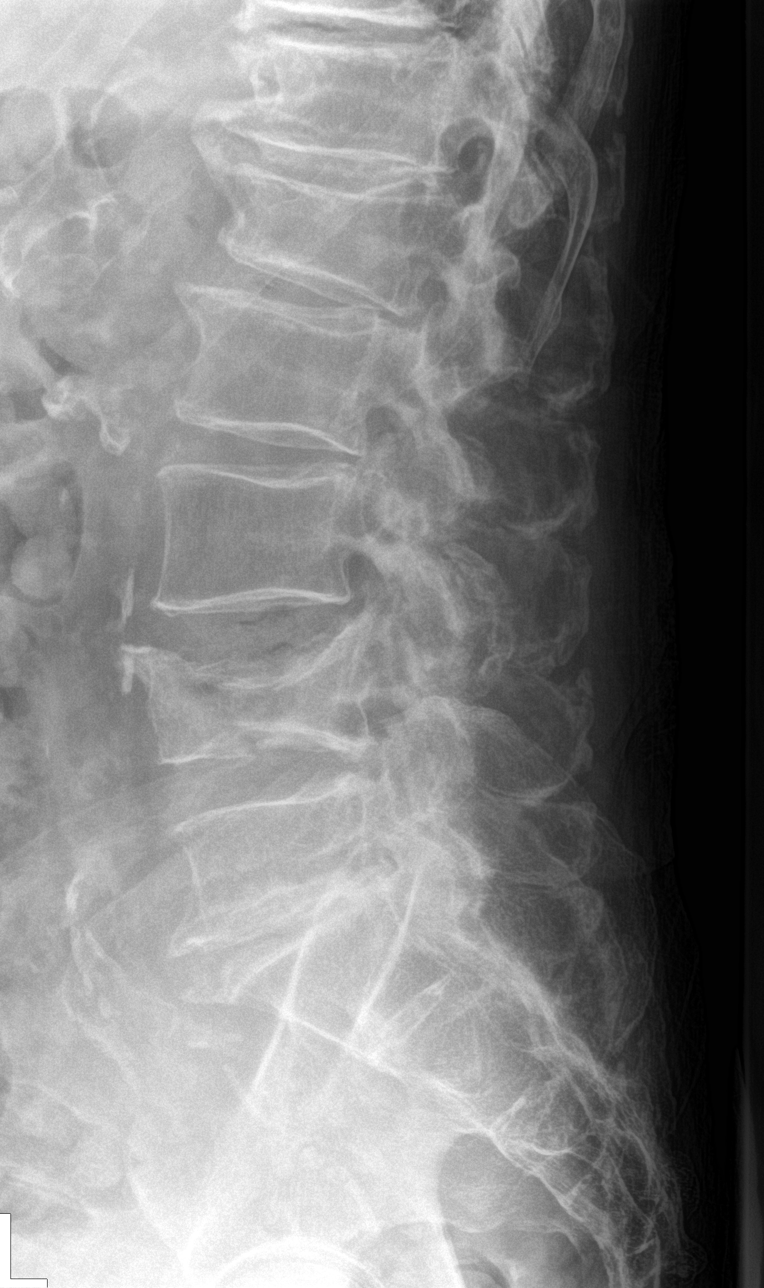

[l-spine spot]
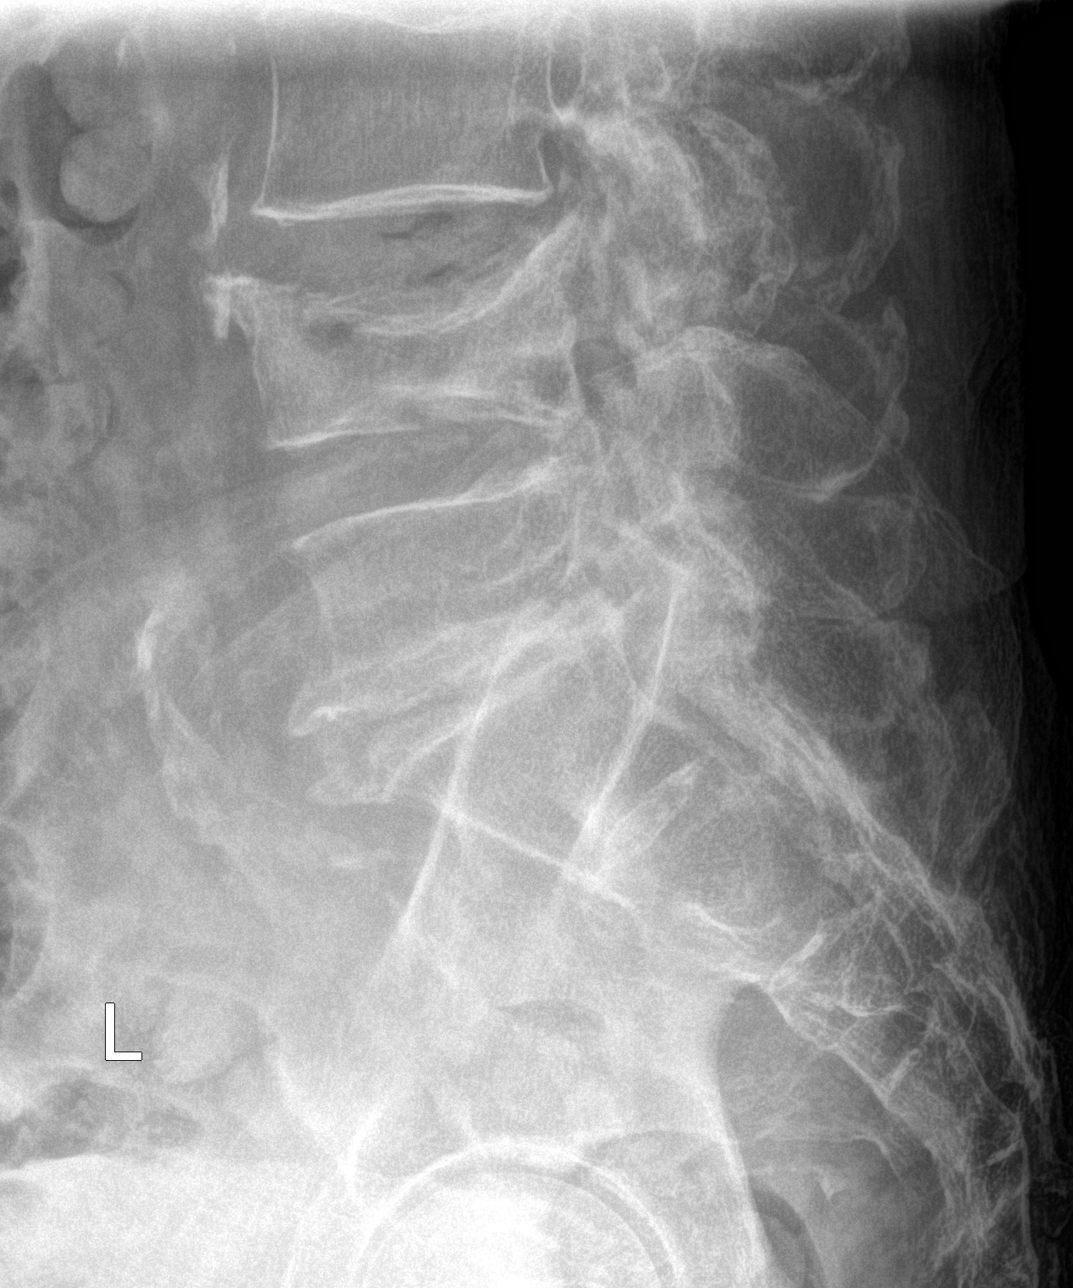

[5 of 5 positions shown; findings below may reference images not displayed]

FINDINGS: Frontal, lateral, spot lumbosacral lateral, and bilateral oblique
views were obtained. The there are 5 non-rib-bearing lumbar type
vertebral bodies. There is fairly marked compression of the L4
vertebral body with disruption of the inferior endplate at L4,
stable. Moderate wedging at T12 and L1 are likewise stable. No new
fracture. No spondylolisthesis. Moderately severe disc space
narrowing at L5-S1 is stable. There is moderate narrowing at T12-L1
and L1-2, stable. There is facet osteoarthritic change at all levels
bilaterally, most severe at L4-5 and L5-S1 bilaterally.
IMPRESSION: Stable compression fractures compared to prior MR examination. No
new fracture. No spondylolisthesis. Multilevel osteoarthritic
change.

## 2016-10-11 IMAGING — CT CT HEAD W/O CM
2 of 6 series · 12 of 47 positions shown, 15 images · non-contrast
Comparison: Head CT 06/27/2014.  C-spine CT 06/27/2014.

CLINICAL DATA: 88-year-old male with new onset of low back pain and
pain in the back of the head since earlier this morning. Dementia.

EXAM:
CT HEAD WITHOUT CONTRAST
CT CERVICAL SPINE WITHOUT CONTRAST
TECHNIQUE: Multidetector CT imaging of the head and cervical spine was
performed following the standard protocol without intravenous
contrast. Multiplanar CT image reconstructions of the cervical spine
were also generated.

[Series 304: axial · axial · 0.32mm/px · z∈[+54,+201]mm · 9 of 104 slices shown, 12 images]
[im 11/104  brain]
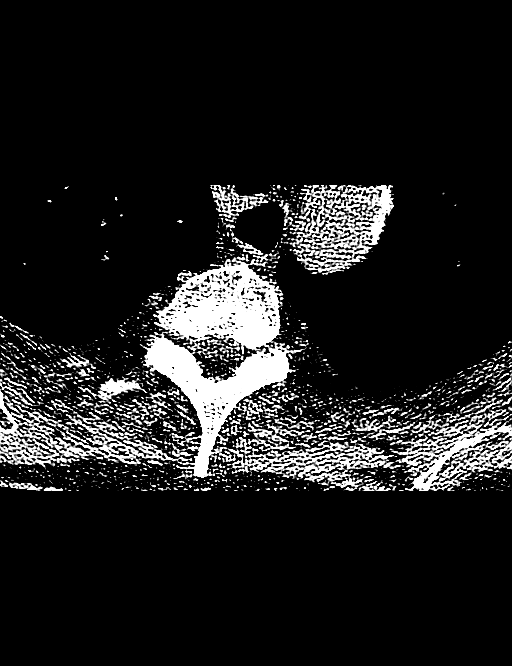
[im 11/104  bone]
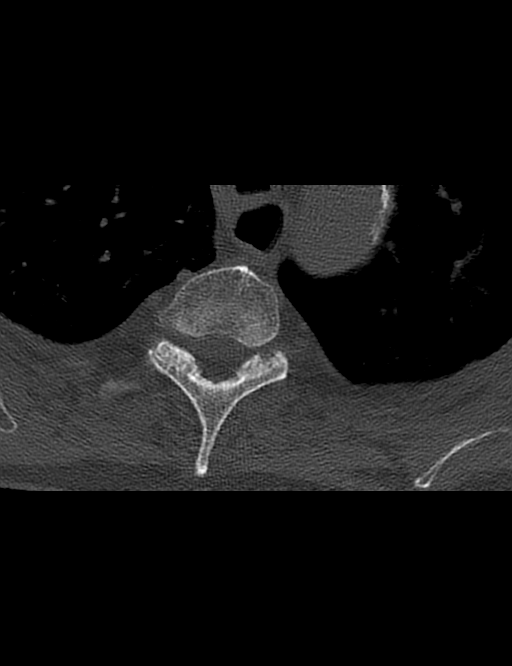
[im 21/104  brain]
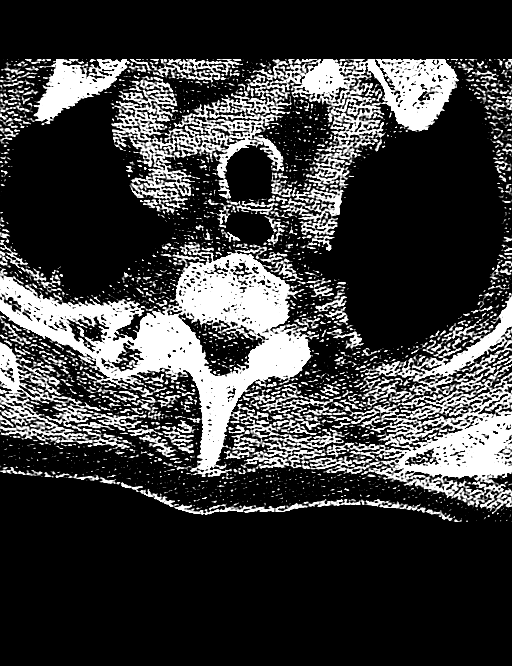
[im 31/104  brain]
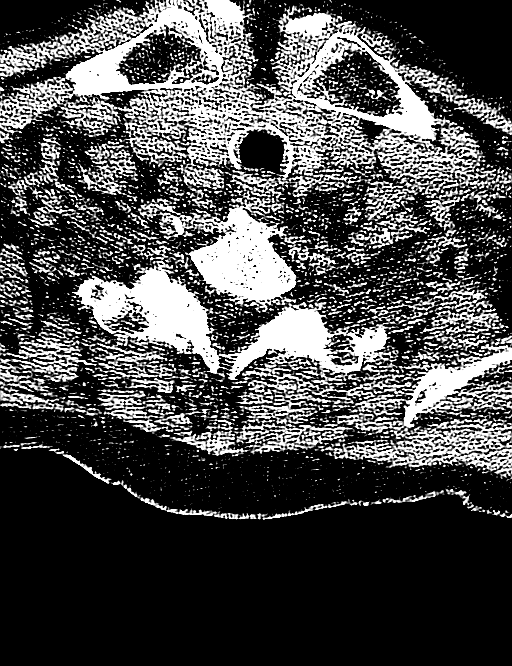
[im 42/104  brain]
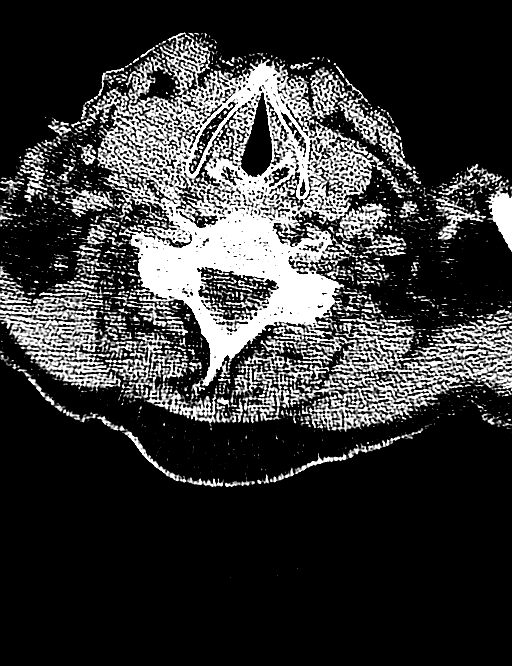
[im 52/104  brain]
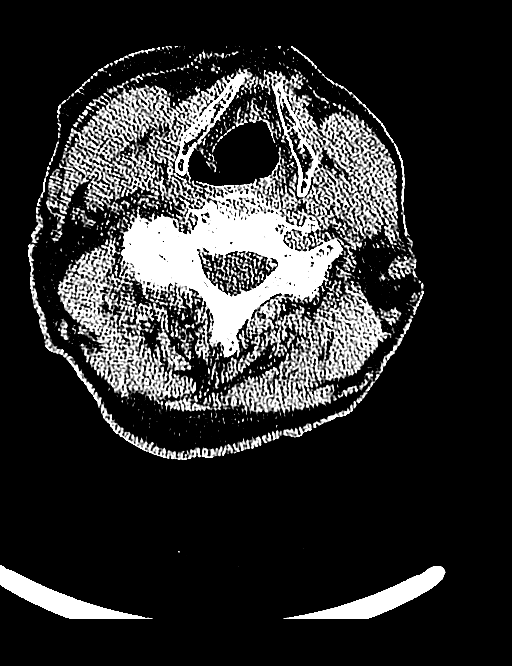
[im 52/104  bone]
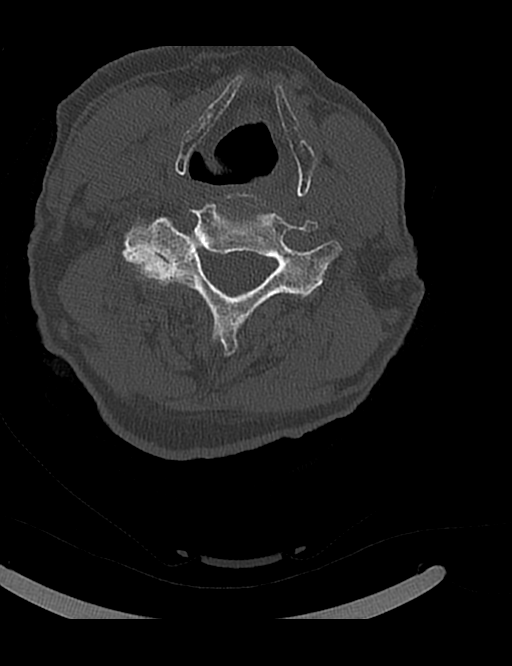
[im 62/104  brain]
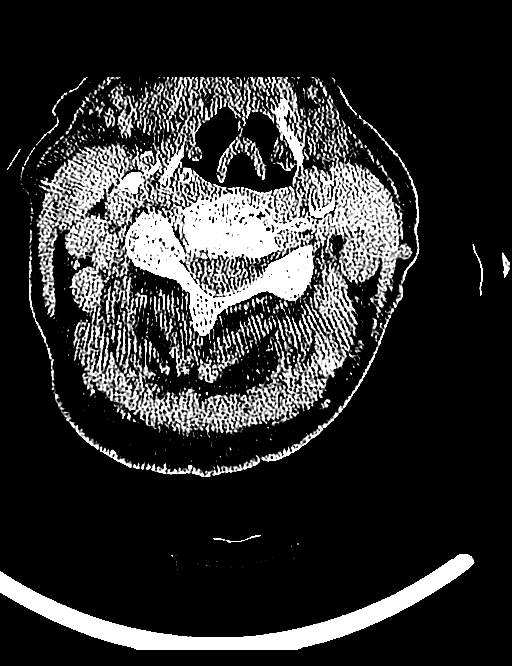
[im 73/104  brain]
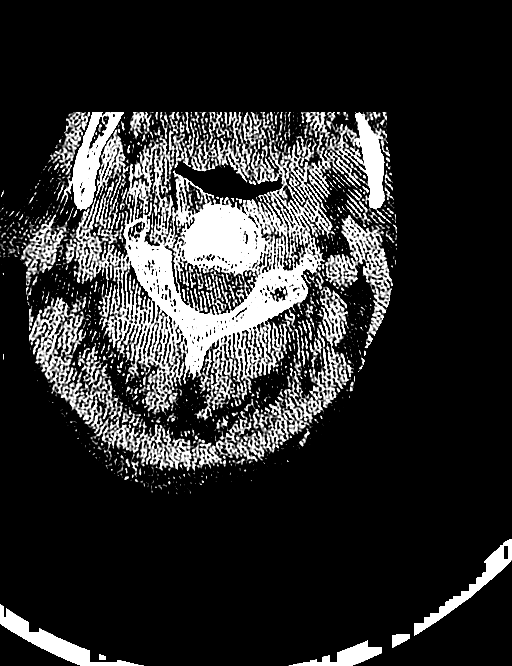
[im 83/104  brain]
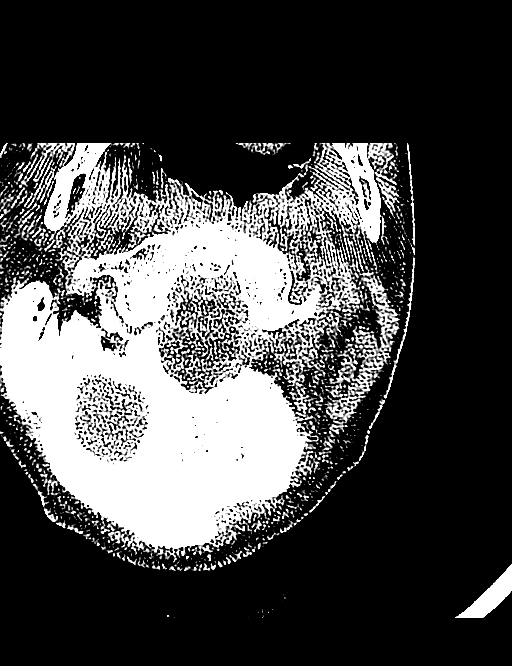
[im 93/104  brain]
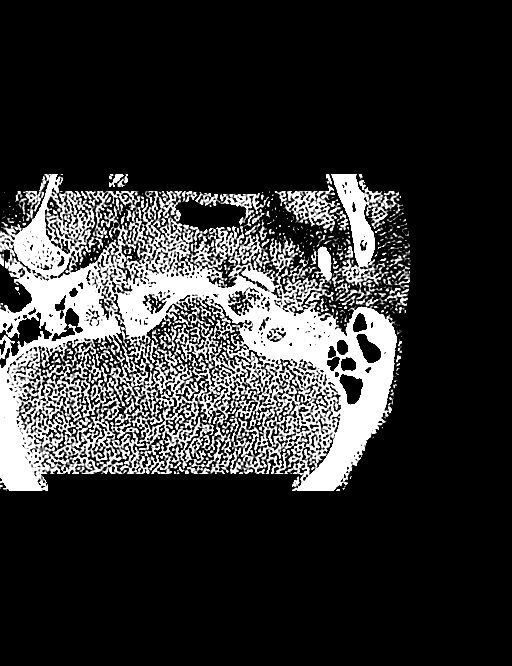
[im 93/104  bone]
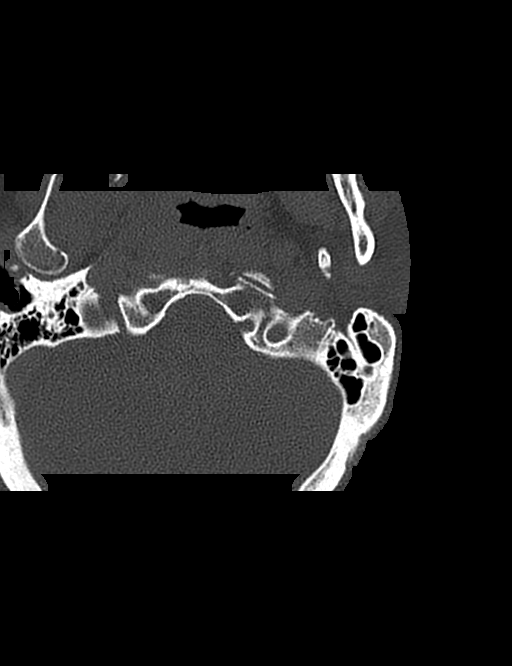

[Series 305: cor · coronal · 0.32mm/px · 3 of 43 slices shown]
[im 15/43  brain]
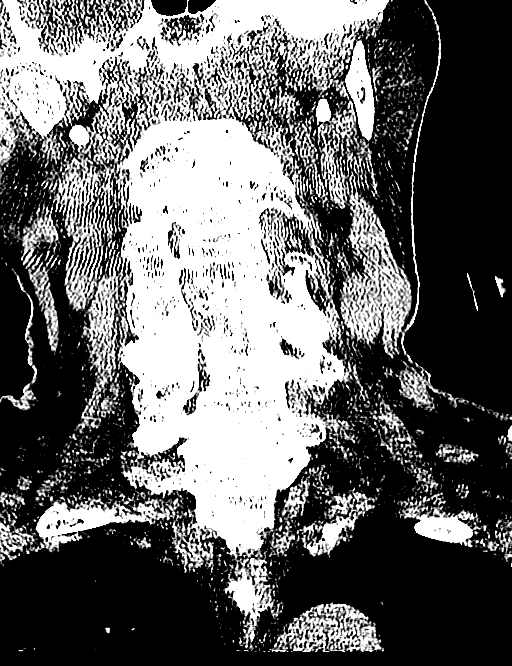
[im 19/43  brain]
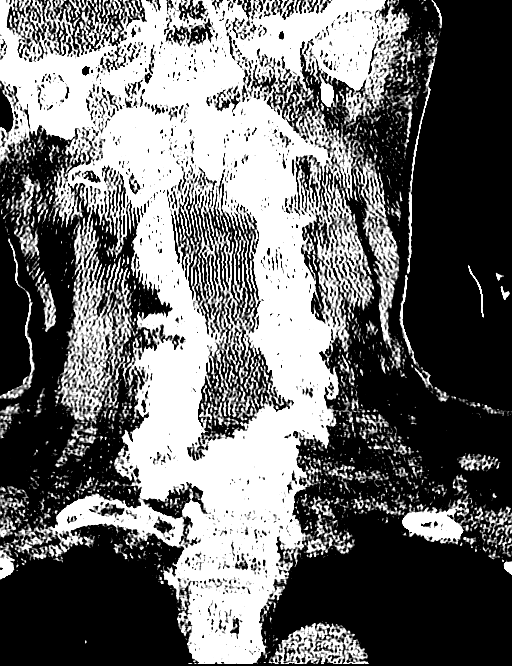
[im 24/43  brain]
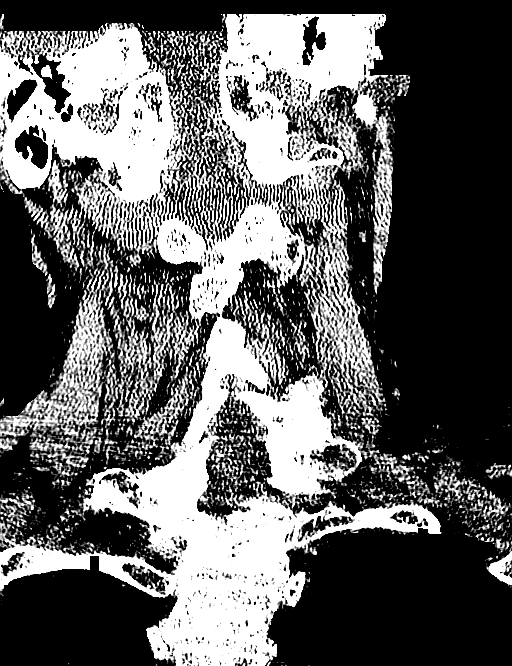

[12 of 47 positions shown; findings below may reference images not displayed]

FINDINGS: CT HEAD FINDINGS

Moderate cerebral and mild cerebellar atrophy. Physiologic
calcifications in the basal ganglia bilaterally. Patchy and
confluent areas of decreased attenuation are noted throughout the
deep and periventricular white matter of the cerebral hemispheres
bilaterally, compatible with chronic microvascular ischemic disease.
No acute intracranial abnormalities. Specifically, no evidence of
acute intracranial hemorrhage, no definite findings of
acute/subacute cerebral ischemia, no mass, mass effect,
hydrocephalus or abnormal intra or extra-axial fluid collections.
Visualized paranasal sinuses and mastoids are well pneumatized. No
acute displaced skull fractures are identified.

CT CERVICAL SPINE FINDINGS

No acute displaced fractures of the cervical spine. Alignment is
anatomic. Prevertebral soft tissues are normal. Severe multilevel
degenerative disc disease, most pronounced at C6-C7. Severe
multilevel facet arthropathy. Visualized portions of the upper
thorax are unremarkable.
IMPRESSION: 1. No evidence of significant acute traumatic injury to the skull,
brain or cervical spine.
2. Moderate cerebral and mild cerebellar atrophy with extensive
chronic microvascular ischemic changes in the cerebral white matter,
similar to prior examinations.
3. Multilevel degenerative disc disease and cervical spondylosis
redemonstrated, as above.
